# Patient Record
Sex: Male | Born: 1935
Health system: Southern US, Community
[De-identification: ages and names within clinical notes are randomized; demographics above are authoritative.]

## PROBLEM LIST (undated history)

## (undated) DIAGNOSIS — E785 Hyperlipidemia, unspecified: Secondary | ICD-10-CM

## (undated) DIAGNOSIS — E119 Type 2 diabetes mellitus without complications: Secondary | ICD-10-CM

## (undated) DIAGNOSIS — I10 Essential (primary) hypertension: Secondary | ICD-10-CM

## (undated) DIAGNOSIS — I4891 Unspecified atrial fibrillation: Secondary | ICD-10-CM

## (undated) DIAGNOSIS — E1149 Type 2 diabetes mellitus with other diabetic neurological complication: Secondary | ICD-10-CM

## (undated) NOTE — *Deleted (*Deleted)
  Subjective: Pain controlled. Tolerating foley. Foley clear yellow.  Objective: Vital signs in last 24 hours: Temp:  [97.7 F (36.5 C)-99.3 F (37.4 C)] 97.7 F (36.5 C) (11/14 1253) Pulse Rate:  [73-96] 76 (11/14 1253) Resp:  [16-18] 18 (11/14 1253) BP: (102-127)/(62-90) 109/90 (11/14 1253) SpO2:  [96 %-100 %] 96 % (11/14 1253)  Intake/Output from previous day: 11/13 0701 - 11/14 0700 In: 1015.7 [I.V.:905.7] Out: 2700 [Urine:2700] Intake/Output this shift: Total I/O In: 528.3 [I.V.:528.3] Out: 1000 [Urine:1000]  Physical Exam:  General: Alert and oriented CV: RRR Lungs: Clear Abdomen: Soft, ND, NT Ext: NT, No erythema  Lab Results: Recent Labs    09/12/20 0320 09/13/20 0104  HGB 11.4* 10.7*  HCT 35.0* 32.6*   BMET Recent Labs    09/12/20 1202 09/13/20 0104  NA 139 139  K 3.4* 3.3*  CL 107 107  CO2 24 23  GLUCOSE 186* 98  BUN 24* 17  CREATININE 2.06* 1.36*  CALCIUM 7.8* 7.5*     Studies/Results: No results found.  Assessment/Plan: 1. Traumatic Foley catheter of time causing gross hematuria. S/p cysto/foley over wire on 11/13.  2. CVA: MRI 09/10/2020 with right MCA infarct.On Eliquis. 3. Atrial fibrillation: On Eliquis 4. History of hypertension, dyslipidemia, diabetes  -Urine clear with slight pink tinge -Leave catheter in place for at least 7 days. Plan for VT on 11/22. Messaged schedulers to arrange f/u in office. -Ok for [facility] from GU standpoint -Following peripherally***   LOS: 2 days   Jannifer Hick 09/13/2020, 4:02 PM Matt R. Ranard Harte MD [facility]  Pager: [phone]

---

## 1998-04-28 ENCOUNTER — Emergency Department (HOSPITAL_COMMUNITY): Admission: EM | Admit: 1998-04-28 | Discharge: 1998-04-28 | Payer: Self-pay | Admitting: Emergency Medicine

## 2002-12-06 ENCOUNTER — Ambulatory Visit (HOSPITAL_COMMUNITY): Admission: RE | Admit: 2002-12-06 | Discharge: 2002-12-06 | Payer: Self-pay | Admitting: Gastroenterology

## 2002-12-06 ENCOUNTER — Encounter (INDEPENDENT_AMBULATORY_CARE_PROVIDER_SITE_OTHER): Payer: Self-pay | Admitting: Specialist

## 2007-07-19 ENCOUNTER — Emergency Department (HOSPITAL_COMMUNITY): Admission: EM | Admit: 2007-07-19 | Discharge: 2007-07-19 | Payer: Self-pay | Admitting: Emergency Medicine

## 2007-07-28 ENCOUNTER — Ambulatory Visit (HOSPITAL_COMMUNITY): Admission: RE | Admit: 2007-07-28 | Discharge: 2007-07-29 | Payer: Self-pay | Admitting: Orthopaedic Surgery

## 2010-04-22 ENCOUNTER — Emergency Department (HOSPITAL_COMMUNITY): Admission: EM | Admit: 2010-04-22 | Discharge: 2010-04-22 | Payer: Self-pay | Admitting: Emergency Medicine

## 2011-03-15 NOTE — Op Note (Signed)
NAME:  Jason Hunter, Jason Hunter NO.:  [id number]   MEDICAL RECORD NO.:  [id number]          PATIENT TYPE:  OIB   LOCATION:  5035                         FACILITY:  [facility]   PHYSICIAN:  Dionne Ano. Gramig III, M.D.DATE OF BIRTH:  28-May-1928   DATE OF PROCEDURE:  07/28/2007  DATE OF DISCHARGE:                               OPERATIVE REPORT   PREOPERATIVE DIAGNOSIS:  Right elbow/radial head fracture with  displacement and ligamentous insufficiency, rule out additional internal  derangement.   POSTOPERATIVE DIAGNOSIS:  Right elbow/radial head fracture with  displacement and ligamentous insufficiency, rule out additional internal  derangement with noted large triceps tendon tear complete in nature off  of the olecranon.   SURGICAL PROCEDURE PERFORMED:  1. Open reduction and internal fixation radial head fracture with      multiple mini-AccuTrak screws.  2. Right lateral ulnar collateral ligament repair with suture anchor      and FiberWire fixation.  3. Stress radiography.  4. Cubital tunnel release (ulnar nerve release) in situ, right elbow.  5. Triceps reconstruction/direct repair, right elbow, with FiberWire      suture and bony tunnel technique.   SURGEON:  Dionne Ano. Amanda Pea, M.D.   ASSISTANT:  Karie Chimera, P.A.-C.   COMPLICATIONS:  None.   ANESTHESIA:  General.   ESTIMATED BLOOD LOSS:  Less than 50 mL.   DRAINS:  One.   INDICATIONS FOR PROCEDURE:  This patient is a very pleasant male who  presents with the above mentioned diagnosis.  I have counseled him with  regards to the risks and benefits of surgery including the risks of  infection, bleeding, anesthesia, damage to normal structures, and  failure of surgery to accomplish its intended goals of relieving  symptoms and restoring function. With this in mind, he desires to  proceed.  All questions were encouraged and answered preoperatively.   OPERATIVE PROCEDURE:  The patient was seen by myself and  anesthesia,  taken to the operating suite, and underwent the smooth induction of  general anesthesia.  Once under general anesthesia, the patient was  examined. We were able to get a nice exam revealing an audible clunk,  crepitance, and subluxation secondary to the displaced fracture about  the radial head. In addition to this, the patient had a very large  sulcus type sign which was evident on palpation of his triceps  suggesting a triceps tear.  The patient was examined at length and  following this, was prepped and draped in the usual sterile fashion.  Once this was done, a curvilinear posterolateral incision was made under  250 mmHg tourniquet control.  I should note that the arm was marked,  permit was signed, and Ancef was given prior to incision and general  anesthetic.   The incision was made.  Dissection was carried down. Skin flaps were  elevated nicely. At this point in time, I identified the crepitus about  the fracture site and, of course, identified the triceps tendon.  I was  anxious to see if the triceps tendon was completely avulsed and it, in  fact, was.  I suctioned  out the bloody debris and cleaned out the bursa.  A bursectomy was performed about this area.   Following this, it was very apparent that the patient would require  triceps, lateral ulnar, collateral ligament, and radial head  reconstruction.  I, at this time, placed retractors well medially and  elevated a posterior flap of skin.  The ulnar nerve was then identified  and released about the arcade of Struthers, medial intermuscular septum,  two heads of the FCU, cubital tunnel, and Osborne's ligament.  It sat  tension free in its groove and did not sublux.  This completed the in  situ ulnar nerve release which was absolutely necessary given the bloody  hematoma in the vicinity and the triceps reconstruction that would be  necessary.  After the ulnar nerve was released, I then turned attention  towards  the triceps tendon.   The triceps tendon was identified.  It was mobilized appropriately and a  [place] stitch was placed with suture strands exiting distally. The bony  area about the olecranon was then prepared, curet, rongeur, and removal  of the bony spur was accomplished followed by placement of drill holes,  crisscrossed in nature, which exited distally in the shaft region.  Mellody Dance needles were placed crisscrossed through this area and the  FiberWire suture were threaded through the [place] needles and then  advanced.  This allowed for reinsertion of the triceps to its anatomic  point of insertion.  At this point in time, I did not want to completely  repair the triceps but tagged the sutures to be repaired as the final  step later in the case.   At this juncture, I made an interval between the ECU and anconeus,  dissected down, identified the lateral ulnar collateral ligament which  was avulsed as well as the radial head fracture.  I should note the  patient had the joint open and a large amount of scuffing about the  capitellum was noted.  Small bits of cartilage were removed and any  nonviable fragments were completely swept from the field with  irrigation, suction and pick up.  Following cleaning the joint out, I  then reassembled the radial head fracture.  There was a very large  radial head piece which was reassembled and four mini-AccuTrak screws  were placed to transfix this in an anatomic reduction.  The patient had  excellent range of motion, recreation of the joint anatomy, and no  complicating features.  I felt excellent about the fixation and felt  that it was firm and stable.  The patient did have a slight defect which  produced a very small amount of crepitance when the patient was  hyperpronated, however, it was my feeling that this would be certainly a  better construct (ORIF than a prosthesis) as we maintained his regular  anatomy.  Overall, the very large piece of  bone sat nicely and the  screws transfixed this area excellently.  With this noted, I then  checked the patient under AP, lateral, and oblique x-rays and noted they  all looked quite well.  I was pleased with the findings.  Once stress  radiography and ORIF of the radial head was performed, I turned  attention towards the ligamentous architecture.   The lateral ulnar collateral ligament was repaired with a 5.5 Arthrex  suture anchor.  The anchor was placed without difficulty at the  epicenter of the lateral condyle region and was then sutured to the  lateral ulnar collateral  ligament.  This was then tied down to repair  the lateral ulnar collateral ligament and additional imbricating sutures  were then placed.  Following this, I closed the interval about the  lateral aspect of the ECU anconeus region deeply.  The patient did not  have destabilization of the annular ligament I should note.   Once this was done, attention was then turned back towards the triceps.  The arm was placed in extension.  The sutures were tied down and then  additional throws were placed through the triceps tendon edges to make a  fine smooth fit with the repair.  This was a bony tunnel repair, of  course.  Following this, the patient was stable at 90 degrees.  He had  no instability. The ulnar nerve looked excellent and I was pleased with  this. I irrigated very copiously and then closed the wound over a medium  Hemovac drain with 2-0 Vicryl followed by the staple gun.  Once this was  done, the patient was placed in a long arm splint with the arm at 30  degrees of flexion.  He tolerated this well and was taken to recovery  room after extubation.  He will be monitored closely.   We will plan for IV antibiotics, general postop management including IV  fluids, pain medicine, muscle relaxers, and will plan to see him back in  the office in 10-14 days for suture removal.  At the time of the first  postop visit, the  skin clips will be removed, x-rays will be taken, AP,  lateral and coyle views.  I will have therapy place him in a long arm  splint at 30 degrees and we will allow him to begin some very gentle  motion at the three week postop mark.  At three weeks postop, we will  begin active biceps gravity extension, triceps, and will work him up 10-  15 degrees each week.  I am going to allow him some very gentle  pronation and supination within a 50/50 arc of motion and simply watch  him radiographically. Absolutely no strengthening until 10-12 weeks and,  for the most part, we are simply going to work on his motion.  The  triceps tendon will need least 8-10 weeks to heal given its significant  fraying and, thus, for the first 6-8 weeks, I do not want him to go past  90 degrees as he is increasing 10 to 15 degrees each week.  We will need  to monitor his care very closely, of course, and assess his fracture for  complete consolidation and healing.  I have discussed with the patient  and his family all issues and all questions have been encouraged and  answered.           ______________________________  Dionne Ano. Everlene Other, M.D.     Nash Mantis  D:  07/28/2007  T:  07/28/2007  Job:  [id number]

## 2011-03-18 NOTE — Op Note (Signed)
   NAME:  Jason Hunter, Jason Hunter                         ACCOUNT NO.:  [id number]   MEDICAL RECORD NO.:  [id number]                   PATIENT TYPE:  AMB   LOCATION:  ENDO                                 FACILITY:  [facility]   PHYSICIAN:  Danise Edge, M.D.                DATE OF BIRTH:  1928-10-31   DATE OF PROCEDURE:  12/06/2002  DATE OF DISCHARGE:                                 OPERATIVE REPORT   PROCEDURE:  Colonoscopy and sigmoid polypectomy.   INDICATIONS:  The patient is a 77 year old male born [birth year].  The patient  underwent a colonoscopy with polypectomy five years ago.  He is due for  screening colonoscopy with polypectomy to prevent colon cancer.   ENDOSCOPIST:  Danise Edge, M.D.   PREMEDICATION:  Versed 7.5 mg, Demerol 75 mg.   ENDOSCOPIST:  Olympus pediatric colonoscope.   DESCRIPTION OF PROCEDURE:  After obtaining informed consent, the patient was  placed in the left lateral decubitus position.  I administered intravenous  Demerol and intravenous Versed to achieve conscious sedation for the  procedure.  The patient's blood pressure, oxygen saturation, and cardiac  rhythm were monitored throughout the procedure and documented in the medical  record.   Anal inspection was normal.  Digital rectal revealed an enlarged but non-  nodular prostate.  The Olympus pediatric video colonoscope was introduced  into the rectum and advanced to the cecum.  Colonic preparation for the exam  today was excellent.   Rectum normal.  Moderate-sized internal hemorrhoids.   Sigmoid colon and descending colon:  Left colonic diverticulosis.  At  approximately 35 cm from the anal verge, a 1 cm pedunculated polyp was  removed with the electrocautery snare.   Splenic flexure normal.   Transverse colon normal.   Hepatic flexure normal.   Ascending colon normal.   Cecum and ileocecal valve normal.   ASSESSMENT:  1. A 1 cm pedunculated polyp was removed from the sigmoid colon.  2. Left  colonic diverticulosis.  3.     Moderate-sized internal hemorrhoids.  4. Enlarged, non-nodular prostate.   RECOMMENDATIONS:  Repeat colonoscopy in five years.                                               Danise Edge, M.D.    MJ/MEDQ  D:  12/06/2002  T:  12/06/2002  Job:  [id number]   cc:   Thora Lance, M.D.  [address]  [place]  [place] [address]  Fax: [phone]

## 2011-08-11 LAB — BASIC METABOLIC PANEL
BUN: 18
CO2: 27
Chloride: 100
Chloride: 100
Creatinine, Ser: 1.2
GFR calc Af Amer: >60
GFR calc non Af Amer: 58 — ABNORMAL LOW
Potassium: 3.8
Sodium: 133 — ABNORMAL LOW
Sodium: 135

## 2011-08-11 LAB — URINE MICROSCOPIC-ADD ON

## 2011-08-11 LAB — CBC
HCT: 36.1 — ABNORMAL LOW
Hemoglobin: 11.3 — ABNORMAL LOW
Hemoglobin: 12 — ABNORMAL LOW
MCHC: 34
MCV: 94.2
MCV: 96.1
RBC: 3.54 — ABNORMAL LOW
RBC: 3.75 — ABNORMAL LOW
WBC: 12.4 — ABNORMAL HIGH
WBC: 6.2

## 2011-08-11 LAB — URINALYSIS, ROUTINE W REFLEX MICROSCOPIC
Bilirubin Urine: NEGATIVE
Glucose, UA: >1000 — AB
Hgb urine dipstick: NEGATIVE
Ketones, ur: NEGATIVE
Nitrite: NEGATIVE
Specific Gravity, Urine: 1.026
pH: 5

## 2011-08-11 LAB — HEMOGLOBIN A1C
Hgb A1c MFr Bld: 7.1 — ABNORMAL HIGH
Mean Plasma Glucose: 175

## 2013-04-30 DEATH — deceased

## 2013-10-15 ENCOUNTER — Encounter (INDEPENDENT_AMBULATORY_CARE_PROVIDER_SITE_OTHER): Payer: Medicare Other | Admitting: Ophthalmology

## 2013-10-15 DIAGNOSIS — E1139 Type 2 diabetes mellitus with other diabetic ophthalmic complication: Secondary | ICD-10-CM

## 2013-10-15 DIAGNOSIS — E11319 Type 2 diabetes mellitus with unspecified diabetic retinopathy without macular edema: Secondary | ICD-10-CM

## 2013-10-15 DIAGNOSIS — H43819 Vitreous degeneration, unspecified eye: Secondary | ICD-10-CM

## 2013-10-15 DIAGNOSIS — H35039 Hypertensive retinopathy, unspecified eye: Secondary | ICD-10-CM

## 2013-10-15 DIAGNOSIS — H35379 Puckering of macula, unspecified eye: Secondary | ICD-10-CM

## 2013-10-15 DIAGNOSIS — I10 Essential (primary) hypertension: Secondary | ICD-10-CM

## 2016-01-30 DEATH — deceased

## 2016-07-05 ENCOUNTER — Other Ambulatory Visit: Payer: Self-pay | Admitting: Internal Medicine

## 2016-07-05 DIAGNOSIS — I4891 Unspecified atrial fibrillation: Secondary | ICD-10-CM

## 2016-07-07 ENCOUNTER — Encounter (HOSPITAL_COMMUNITY): Payer: Self-pay

## 2016-07-07 ENCOUNTER — Emergency Department (HOSPITAL_COMMUNITY)
Admission: EM | Admit: 2016-07-07 | Discharge: 2016-07-07 | Disposition: A | Discharge status: Home / Self Care | Payer: Medicare Other | Attending: Emergency Medicine | Admitting: Emergency Medicine

## 2016-07-07 DIAGNOSIS — I10 Essential (primary) hypertension: Secondary | ICD-10-CM | POA: Diagnosis not present

## 2016-07-07 DIAGNOSIS — K625 Hemorrhage of anus and rectum: Secondary | ICD-10-CM | POA: Diagnosis present

## 2016-07-07 DIAGNOSIS — Z79899 Other long term (current) drug therapy: Secondary | ICD-10-CM | POA: Insufficient documentation

## 2016-07-07 DIAGNOSIS — E119 Type 2 diabetes mellitus without complications: Secondary | ICD-10-CM | POA: Insufficient documentation

## 2016-07-07 DIAGNOSIS — Z7984 Long term (current) use of oral hypoglycemic drugs: Secondary | ICD-10-CM | POA: Diagnosis not present

## 2016-07-07 DIAGNOSIS — Z7901 Long term (current) use of anticoagulants: Secondary | ICD-10-CM | POA: Insufficient documentation

## 2016-07-07 HISTORY — DX: Type 2 diabetes mellitus without complications: E11.9

## 2016-07-07 HISTORY — DX: Essential (primary) hypertension: I10

## 2016-07-07 LAB — CBC
HEMATOCRIT: 38.1 % — AB (ref 39.0–52.0)
HEMOGLOBIN: 12.1 g/dL — AB (ref 13.0–17.0)
MCH: 30.9 pg (ref 26.0–34.0)
MCHC: 31.8 g/dL (ref 30.0–36.0)
MCV: 97.4 fL (ref 78.0–100.0)
Platelets: 314 10*3/uL (ref 150–400)
RBC: 3.91 MIL/uL — ABNORMAL LOW (ref 4.22–5.81)
RDW: 13.7 % (ref 11.5–15.5)
WBC: 5.6 10*3/uL (ref 4.0–10.5)

## 2016-07-07 LAB — COMPREHENSIVE METABOLIC PANEL
ALBUMIN: 3.7 g/dL (ref 3.5–5.0)
ALK PHOS: 134 U/L — AB (ref 38–126)
ALT: 8 U/L — ABNORMAL LOW (ref 17–63)
ANION GAP: 9 (ref 5–15)
AST: 16 U/L (ref 15–41)
BILIRUBIN TOTAL: 1 mg/dL (ref 0.3–1.2)
BUN: 14 mg/dL (ref 6–20)
CALCIUM: 9 mg/dL (ref 8.9–10.3)
CO2: 25 mmol/L (ref 22–32)
Chloride: 105 mmol/L (ref 101–111)
Creatinine, Ser: 1.04 mg/dL (ref 0.61–1.24)
GLUCOSE: 174 mg/dL — AB (ref 65–99)
POTASSIUM: 4.7 mmol/L (ref 3.5–5.1)
Sodium: 139 mmol/L (ref 135–145)
TOTAL PROTEIN: 7 g/dL (ref 6.5–8.1)

## 2016-07-07 LAB — POC OCCULT BLOOD, ED: FECAL OCCULT BLD: POSITIVE — AB

## 2016-07-07 LAB — PROTIME-INR
INR: 1.8
PROTHROMBIN TIME: 21.1 s — AB (ref 11.4–15.2)

## 2016-07-07 NOTE — ED Provider Notes (Signed)
[facility]-EMERGENCY DEPT Provider Note   CSN: [id number] Arrival date & time: 07/07/16  1000     History   Chief Complaint Chief Complaint  Patient presents with  . Nausea  . Blood In Stools    HPI Jason Hunter is a 82 y.o. male.  Patient has recently been switched over to low wall toe from aspirin. He started having some dark red blood per rectum. Patient not having any weakness dizziness no pain   The history is provided by the patient. No language interpreter was used.  Rectal Bleeding  Quality:  Maroon Amount:  Moderate Timing:  Intermittent Chronicity:  New Context: not anal fissures   Similar prior episodes: no   Relieved by:  Nothing Worsened by:  Nothing Associated symptoms: no abdominal pain   Risk factors: anticoagulant use     Past Medical History:  Diagnosis Date  . Diabetes mellitus without complication (HCC)   . Hypertension     There are no active problems to display for this patient.   History reviewed. No pertinent surgical history.     Home Medications    Prior to Admission medications   Medication Sig Start Date End Date Taking? Authorizing Provider  carvedilol (COREG) 25 MG tablet Take 25 mg by mouth 2 (two) times daily. 06/19/16  Yes Historical Provider, MD  chlorthalidone (HYGROTON) 25 MG tablet Take 25 mg by mouth every morning. 06/06/16  Yes Historical Provider, MD  felodipine (PLENDIL) 10 MG 24 hr tablet Take 10 mg by mouth daily. 06/19/16  Yes Historical Provider, MD  glipiZIDE (GLUCOTROL XL) 5 MG 24 hr tablet Take 5 mg by mouth daily. 06/14/16  Yes Historical Provider, MD  potassium chloride SA (K-DUR,KLOR-CON) 20 MEQ tablet Take 20 mEq by mouth daily. Take 1 tablet twice daily for 3 days, then continue 1 tablet daily, thereafter 07/05/16  Yes Historical Provider, MD  pravastatin (PRAVACHOL) 20 MG tablet Take 20 mg by mouth every morning. 06/25/16  Yes Historical Provider, MD  rivaroxaban (XARELTO) 20 MG TABS tablet Take 20 mg by mouth  daily.   Yes Historical Provider, MD  telmisartan (MICARDIS) 80 MG tablet Take 80 mg by mouth daily. 06/07/16  Yes Historical Provider, MD    Family History No family history on file.  Social History Social History  Substance Use Topics  . Smoking status: Never Smoker  . Smokeless tobacco: Never Used  . Alcohol use No     Allergies   Review of patient's allergies indicates no known allergies.   Review of Systems Review of Systems  Constitutional: Negative for appetite change and fatigue.  HENT: Negative for congestion, ear discharge and sinus pressure.   Eyes: Negative for discharge.  Respiratory: Negative for cough.   Cardiovascular: Negative for chest pain.  Gastrointestinal: Positive for hematochezia. Negative for abdominal pain and diarrhea.       Rectal bleeding  Genitourinary: Negative for frequency and hematuria.  Musculoskeletal: Negative for back pain.  Skin: Negative for rash.  Neurological: Negative for seizures and headaches.  Psychiatric/Behavioral: Negative for hallucinations.     Physical Exam Updated Vital Signs BP 130/84   Pulse (!) 57   Temp 98.1 F (36.7 C) (Oral)   Resp 15   SpO2 100%   Physical Exam  Constitutional: He is oriented to person, place, and time. He appears well-developed.  HENT:  Head: Normocephalic.  Eyes: Conjunctivae and EOM are normal. No scleral icterus.  Neck: Neck supple. No thyromegaly present.  Cardiovascular: Normal rate and  regular rhythm.  Exam reveals no gallop and no friction rub.   No murmur heard. Pulmonary/Chest: No stridor. He has no wheezes. He has no rales. He exhibits no tenderness.  Abdominal: He exhibits no distension. There is no tenderness. There is no rebound.  Genitourinary: Rectal exam shows guaiac positive stool.  Genitourinary Comments: Maroon heme positive stool  Musculoskeletal: Normal range of motion. He exhibits no edema.  Lymphadenopathy:    He has no cervical adenopathy.  Neurological: He  is oriented to person, place, and time. He exhibits normal muscle tone. Coordination normal.  Skin: No rash noted. No erythema.  Psychiatric: He has a normal mood and affect. His behavior is normal.     ED Treatments / Results  Labs (all labs ordered are listed, but only abnormal results are displayed) Labs Reviewed  COMPREHENSIVE METABOLIC PANEL - Abnormal; Notable for the following:       Result Value   Glucose, Bld 174 (*)    ALT 8 (*)    Alkaline Phosphatase 134 (*)    All other components within normal limits  CBC - Abnormal; Notable for the following:    RBC 3.91 (*)    Hemoglobin 12.1 (*)    HCT 38.1 (*)    All other components within normal limits  PROTIME-INR - Abnormal; Notable for the following:    Prothrombin Time 21.1 (*)    All other components within normal limits  POC OCCULT BLOOD, ED - Abnormal; Notable for the following:    Fecal Occult Bld POSITIVE (*)    All other components within normal limits    EKG  EKG Interpretation None       Radiology No results found.  Procedures Procedures (including critical care time)  Medications Ordered in ED Medications - No data to display   Initial Impression / Assessment and Plan / ED Course  I have reviewed the triage vital signs and the nursing notes.  Pertinent labs & imaging results that were available during my care of the patient were reviewed by me and considered in my medical decision making (see chart for details).  Clinical Course    Patient with rectal bleeding. Patient is not tachycardic or hypotensive. Patient complaining dizziness no pain. Labs unremarkable. Patient prefers to be discharged home follow-up with his doctor. He was instructed to stop taking those were also call his doctor tomorrow for follow-up.   Patient did not want to be admitted and preferred outpatient treatment. He was told to return for weakness dizziness abdominal pain or moderate bleeding  Final Clinical Impressions(s) /  ED Diagnoses   Final diagnoses:  Rectal bleeding    New Prescriptions New Prescriptions   No medications on file     Bethann Berkshire, MD 07/07/16 1523

## 2016-07-07 NOTE — Discharge Instructions (Signed)
Stop taking your xarelto.   Follow up with your md next week.  Return if feeling weak or dizzy or you have significant bleeding

## 2016-07-07 NOTE — ED Triage Notes (Signed)
Pt. Reports starting a new medication this week and yesterday he strted having loose stoole with dark red blood in it.  Dr. Valentina LucksGriffin sent pt. To us for a further workup..  Pt. Denies any n/v.  No distress noted.  Pt. Denies any abdominal pain .

## 2016-07-07 NOTE — ED Notes (Signed)
Patient denies pain and is resting comfortably.  

## 2016-07-12 ENCOUNTER — Other Ambulatory Visit (HOSPITAL_COMMUNITY): Payer: Self-pay

## 2016-08-30 ENCOUNTER — Ambulatory Visit (HOSPITAL_COMMUNITY): Payer: Medicare Other | Attending: Cardiology

## 2016-08-30 ENCOUNTER — Other Ambulatory Visit: Payer: Self-pay

## 2016-08-30 DIAGNOSIS — I4891 Unspecified atrial fibrillation: Secondary | ICD-10-CM | POA: Diagnosis not present

## 2016-11-28 DIAGNOSIS — I10 Essential (primary) hypertension: Secondary | ICD-10-CM | POA: Diagnosis not present

## 2016-11-28 DIAGNOSIS — E119 Type 2 diabetes mellitus without complications: Secondary | ICD-10-CM | POA: Diagnosis not present

## 2016-11-28 DIAGNOSIS — I481 Persistent atrial fibrillation: Secondary | ICD-10-CM | POA: Diagnosis not present

## 2016-11-28 DIAGNOSIS — Z7984 Long term (current) use of oral hypoglycemic drugs: Secondary | ICD-10-CM | POA: Diagnosis not present

## 2017-05-24 DIAGNOSIS — E7800 Pure hypercholesterolemia, unspecified: Secondary | ICD-10-CM | POA: Diagnosis not present

## 2017-05-24 DIAGNOSIS — N182 Chronic kidney disease, stage 2 (mild): Secondary | ICD-10-CM | POA: Diagnosis not present

## 2017-05-24 DIAGNOSIS — I10 Essential (primary) hypertension: Secondary | ICD-10-CM | POA: Diagnosis not present

## 2017-05-24 DIAGNOSIS — E1165 Type 2 diabetes mellitus with hyperglycemia: Secondary | ICD-10-CM | POA: Diagnosis not present

## 2017-05-24 DIAGNOSIS — I481 Persistent atrial fibrillation: Secondary | ICD-10-CM | POA: Diagnosis not present

## 2017-05-24 DIAGNOSIS — Z1389 Encounter for screening for other disorder: Secondary | ICD-10-CM | POA: Diagnosis not present

## 2017-05-24 DIAGNOSIS — Z0000 Encounter for general adult medical examination without abnormal findings: Secondary | ICD-10-CM | POA: Diagnosis not present

## 2017-05-24 DIAGNOSIS — Z7984 Long term (current) use of oral hypoglycemic drugs: Secondary | ICD-10-CM | POA: Diagnosis not present

## 2017-05-24 DIAGNOSIS — E1122 Type 2 diabetes mellitus with diabetic chronic kidney disease: Secondary | ICD-10-CM | POA: Diagnosis not present

## 2018-05-29 DIAGNOSIS — E1122 Type 2 diabetes mellitus with diabetic chronic kidney disease: Secondary | ICD-10-CM | POA: Diagnosis not present

## 2018-05-29 DIAGNOSIS — I481 Persistent atrial fibrillation: Secondary | ICD-10-CM | POA: Diagnosis not present

## 2018-05-29 DIAGNOSIS — E7800 Pure hypercholesterolemia, unspecified: Secondary | ICD-10-CM | POA: Diagnosis not present

## 2018-05-29 DIAGNOSIS — I10 Essential (primary) hypertension: Secondary | ICD-10-CM | POA: Diagnosis not present

## 2018-05-29 DIAGNOSIS — Z0000 Encounter for general adult medical examination without abnormal findings: Secondary | ICD-10-CM | POA: Diagnosis not present

## 2018-05-29 DIAGNOSIS — Z1389 Encounter for screening for other disorder: Secondary | ICD-10-CM | POA: Diagnosis not present

## 2018-05-29 DIAGNOSIS — Z7189 Other specified counseling: Secondary | ICD-10-CM | POA: Diagnosis not present

## 2018-05-29 DIAGNOSIS — Z7984 Long term (current) use of oral hypoglycemic drugs: Secondary | ICD-10-CM | POA: Diagnosis not present

## 2018-05-29 DIAGNOSIS — E1165 Type 2 diabetes mellitus with hyperglycemia: Secondary | ICD-10-CM | POA: Diagnosis not present

## 2018-05-29 DIAGNOSIS — N400 Enlarged prostate without lower urinary tract symptoms: Secondary | ICD-10-CM | POA: Diagnosis not present

## 2018-12-03 DIAGNOSIS — N182 Chronic kidney disease, stage 2 (mild): Secondary | ICD-10-CM | POA: Diagnosis not present

## 2018-12-03 DIAGNOSIS — E1122 Type 2 diabetes mellitus with diabetic chronic kidney disease: Secondary | ICD-10-CM | POA: Diagnosis not present

## 2018-12-03 DIAGNOSIS — I4819 Other persistent atrial fibrillation: Secondary | ICD-10-CM | POA: Diagnosis not present

## 2018-12-03 DIAGNOSIS — I10 Essential (primary) hypertension: Secondary | ICD-10-CM | POA: Diagnosis not present

## 2018-12-03 DIAGNOSIS — Z23 Encounter for immunization: Secondary | ICD-10-CM | POA: Diagnosis not present

## 2018-12-27 DIAGNOSIS — H40013 Open angle with borderline findings, low risk, bilateral: Secondary | ICD-10-CM | POA: Diagnosis not present

## 2019-06-06 DIAGNOSIS — I10 Essential (primary) hypertension: Secondary | ICD-10-CM | POA: Diagnosis not present

## 2019-06-06 DIAGNOSIS — I4819 Other persistent atrial fibrillation: Secondary | ICD-10-CM | POA: Diagnosis not present

## 2019-06-06 DIAGNOSIS — Z7984 Long term (current) use of oral hypoglycemic drugs: Secondary | ICD-10-CM | POA: Diagnosis not present

## 2019-06-06 DIAGNOSIS — E1139 Type 2 diabetes mellitus with other diabetic ophthalmic complication: Secondary | ICD-10-CM | POA: Diagnosis not present

## 2019-06-06 DIAGNOSIS — E7800 Pure hypercholesterolemia, unspecified: Secondary | ICD-10-CM | POA: Diagnosis not present

## 2019-06-06 DIAGNOSIS — Z0000 Encounter for general adult medical examination without abnormal findings: Secondary | ICD-10-CM | POA: Diagnosis not present

## 2019-06-06 DIAGNOSIS — E1122 Type 2 diabetes mellitus with diabetic chronic kidney disease: Secondary | ICD-10-CM | POA: Diagnosis not present

## 2019-06-06 DIAGNOSIS — Z1389 Encounter for screening for other disorder: Secondary | ICD-10-CM | POA: Diagnosis not present

## 2019-06-06 DIAGNOSIS — N182 Chronic kidney disease, stage 2 (mild): Secondary | ICD-10-CM | POA: Diagnosis not present

## 2019-06-06 DIAGNOSIS — N400 Enlarged prostate without lower urinary tract symptoms: Secondary | ICD-10-CM | POA: Diagnosis not present

## 2019-12-10 DIAGNOSIS — E1122 Type 2 diabetes mellitus with diabetic chronic kidney disease: Secondary | ICD-10-CM | POA: Diagnosis not present

## 2019-12-10 DIAGNOSIS — N182 Chronic kidney disease, stage 2 (mild): Secondary | ICD-10-CM | POA: Diagnosis not present

## 2019-12-10 DIAGNOSIS — I10 Essential (primary) hypertension: Secondary | ICD-10-CM | POA: Diagnosis not present

## 2019-12-10 DIAGNOSIS — I4819 Other persistent atrial fibrillation: Secondary | ICD-10-CM | POA: Diagnosis not present

## 2019-12-29 DIAGNOSIS — Z23 Encounter for immunization: Secondary | ICD-10-CM | POA: Diagnosis not present

## 2020-01-26 DIAGNOSIS — Z23 Encounter for immunization: Secondary | ICD-10-CM | POA: Diagnosis not present

## 2020-09-10 ENCOUNTER — Emergency Department (HOSPITAL_COMMUNITY): Payer: Medicare Other

## 2020-09-10 ENCOUNTER — Encounter (HOSPITAL_COMMUNITY): Payer: Self-pay | Admitting: Internal Medicine

## 2020-09-10 ENCOUNTER — Observation Stay (HOSPITAL_COMMUNITY): Payer: Medicare Other

## 2020-09-10 ENCOUNTER — Inpatient Hospital Stay (HOSPITAL_COMMUNITY)
Admission: EM | Admit: 2020-09-10 | Discharge: 2020-09-13 | DRG: 982 | Disposition: A | Discharge status: Home Health | Payer: Medicare Other | Attending: Internal Medicine | Admitting: Internal Medicine

## 2020-09-10 DIAGNOSIS — I10 Essential (primary) hypertension: Secondary | ICD-10-CM | POA: Diagnosis present

## 2020-09-10 DIAGNOSIS — R339 Retention of urine, unspecified: Secondary | ICD-10-CM | POA: Diagnosis not present

## 2020-09-10 DIAGNOSIS — Y92239 Unspecified place in hospital as the place of occurrence of the external cause: Secondary | ICD-10-CM | POA: Diagnosis not present

## 2020-09-10 DIAGNOSIS — H539 Unspecified visual disturbance: Secondary | ICD-10-CM | POA: Diagnosis present

## 2020-09-10 DIAGNOSIS — G459 Transient cerebral ischemic attack, unspecified: Secondary | ICD-10-CM | POA: Diagnosis not present

## 2020-09-10 DIAGNOSIS — Z7901 Long term (current) use of anticoagulants: Secondary | ICD-10-CM

## 2020-09-10 DIAGNOSIS — I480 Paroxysmal atrial fibrillation: Secondary | ICD-10-CM | POA: Diagnosis not present

## 2020-09-10 DIAGNOSIS — E7800 Pure hypercholesterolemia, unspecified: Secondary | ICD-10-CM | POA: Diagnosis present

## 2020-09-10 DIAGNOSIS — R471 Dysarthria and anarthria: Secondary | ICD-10-CM | POA: Diagnosis present

## 2020-09-10 DIAGNOSIS — R310 Gross hematuria: Secondary | ICD-10-CM | POA: Diagnosis present

## 2020-09-10 DIAGNOSIS — Z20822 Contact with and (suspected) exposure to covid-19: Secondary | ICD-10-CM | POA: Diagnosis present

## 2020-09-10 DIAGNOSIS — E049 Nontoxic goiter, unspecified: Secondary | ICD-10-CM | POA: Diagnosis present

## 2020-09-10 DIAGNOSIS — R338 Other retention of urine: Secondary | ICD-10-CM

## 2020-09-10 DIAGNOSIS — Y848 Other medical procedures as the cause of abnormal reaction of the patient, or of later complication, without mention of misadventure at the time of the procedure: Secondary | ICD-10-CM | POA: Diagnosis not present

## 2020-09-10 DIAGNOSIS — I63411 Cerebral infarction due to embolism of right middle cerebral artery: Principal | ICD-10-CM | POA: Diagnosis present

## 2020-09-10 DIAGNOSIS — Z7984 Long term (current) use of oral hypoglycemic drugs: Secondary | ICD-10-CM

## 2020-09-10 DIAGNOSIS — E1349 Other specified diabetes mellitus with other diabetic neurological complication: Secondary | ICD-10-CM | POA: Diagnosis not present

## 2020-09-10 DIAGNOSIS — I6389 Other cerebral infarction: Secondary | ICD-10-CM

## 2020-09-10 DIAGNOSIS — E119 Type 2 diabetes mellitus without complications: Secondary | ICD-10-CM | POA: Diagnosis present

## 2020-09-10 DIAGNOSIS — E1149 Type 2 diabetes mellitus with other diabetic neurological complication: Secondary | ICD-10-CM | POA: Diagnosis present

## 2020-09-10 DIAGNOSIS — Z9114 Patient's other noncompliance with medication regimen: Secondary | ICD-10-CM

## 2020-09-10 DIAGNOSIS — I69354 Hemiplegia and hemiparesis following cerebral infarction affecting left non-dominant side: Secondary | ICD-10-CM

## 2020-09-10 DIAGNOSIS — T83098A Other mechanical complication of other indwelling urethral catheter, initial encounter: Secondary | ICD-10-CM | POA: Diagnosis not present

## 2020-09-10 DIAGNOSIS — Z79899 Other long term (current) drug therapy: Secondary | ICD-10-CM

## 2020-09-10 DIAGNOSIS — R29704 NIHSS score 4: Secondary | ICD-10-CM | POA: Diagnosis present

## 2020-09-10 DIAGNOSIS — I4891 Unspecified atrial fibrillation: Secondary | ICD-10-CM | POA: Diagnosis present

## 2020-09-10 DIAGNOSIS — E785 Hyperlipidemia, unspecified: Secondary | ICD-10-CM | POA: Diagnosis present

## 2020-09-10 DIAGNOSIS — R29810 Facial weakness: Secondary | ICD-10-CM | POA: Diagnosis present

## 2020-09-10 DIAGNOSIS — I639 Cerebral infarction, unspecified: Secondary | ICD-10-CM

## 2020-09-10 HISTORY — DX: Unspecified atrial fibrillation: I48.91

## 2020-09-10 HISTORY — DX: Hyperlipidemia, unspecified: E78.5

## 2020-09-10 HISTORY — DX: Essential (primary) hypertension: I10

## 2020-09-10 HISTORY — DX: Type 2 diabetes mellitus with other diabetic neurological complication: E11.49

## 2020-09-10 LAB — URINALYSIS, ROUTINE W REFLEX MICROSCOPIC
Bilirubin Urine: NEGATIVE
Glucose, UA: NEGATIVE mg/dL
Ketones, ur: NEGATIVE mg/dL
Leukocytes,Ua: NEGATIVE
Nitrite: NEGATIVE
Protein, ur: NEGATIVE mg/dL
Specific Gravity, Urine: 1.004 — ABNORMAL LOW (ref 1.005–1.030)
pH: 7 (ref 5.0–8.0)

## 2020-09-10 LAB — RAPID URINE DRUG SCREEN, HOSP PERFORMED
Amphetamines: NOT DETECTED
Barbiturates: NOT DETECTED
Benzodiazepines: NOT DETECTED
Cocaine: NOT DETECTED
Opiates: NOT DETECTED
Tetrahydrocannabinol: NOT DETECTED

## 2020-09-10 LAB — GLUCOSE, CAPILLARY: Glucose-Capillary: 168 mg/dL — ABNORMAL HIGH (ref 70–99)

## 2020-09-10 LAB — ETHANOL: Alcohol, Ethyl (B): <10 mg/dL (ref <10)

## 2020-09-10 LAB — DIFFERENTIAL
Abs Immature Granulocytes: 0.05 10*3/uL (ref 0.00–0.07)
Basophils Absolute: 0 10*3/uL (ref 0.0–0.1)
Basophils Relative: 0 %
Eosinophils Absolute: 0 10*3/uL (ref 0.0–0.5)
Eosinophils Relative: 1 %
Immature Granulocytes: 1 %
Lymphocytes Relative: 27 %
Lymphs Abs: 1.7 10*3/uL (ref 0.7–4.0)
Monocytes Absolute: 0.8 10*3/uL (ref 0.1–1.0)
Monocytes Relative: 13 %
Neutro Abs: 3.7 10*3/uL (ref 1.7–7.7)
Neutrophils Relative %: 58 %

## 2020-09-10 LAB — CBC
HCT: 38.7 % — ABNORMAL LOW (ref 39.0–52.0)
Hemoglobin: 11.9 g/dL — ABNORMAL LOW (ref 13.0–17.0)
MCH: 30.2 pg (ref 26.0–34.0)
MCHC: 30.7 g/dL (ref 30.0–36.0)
MCV: 98.2 fL (ref 80.0–100.0)
Platelets: 306 10*3/uL (ref 150–400)
RBC: 3.94 MIL/uL — ABNORMAL LOW (ref 4.22–5.81)
RDW: 13.6 % (ref 11.5–15.5)
WBC: 6.2 10*3/uL (ref 4.0–10.5)
nRBC: 0 % (ref 0.0–0.2)

## 2020-09-10 LAB — ECHOCARDIOGRAM COMPLETE
Area-P 1/2: 6.96 cm2
S' Lateral: 2.5 cm

## 2020-09-10 LAB — COMPREHENSIVE METABOLIC PANEL
ALT: 16 U/L (ref 0–44)
AST: 23 U/L (ref 15–41)
Albumin: 3.7 g/dL (ref 3.5–5.0)
Alkaline Phosphatase: 130 U/L — ABNORMAL HIGH (ref 38–126)
Anion gap: 9 (ref 5–15)
BUN: 13 mg/dL (ref 8–23)
CO2: 23 mmol/L (ref 22–32)
Calcium: 8.8 mg/dL — ABNORMAL LOW (ref 8.9–10.3)
Chloride: 106 mmol/L (ref 98–111)
Creatinine, Ser: 0.96 mg/dL (ref 0.61–1.24)
GFR, Estimated: >60 mL/min (ref >60)
Glucose, Bld: 120 mg/dL — ABNORMAL HIGH (ref 70–99)
Potassium: 3.7 mmol/L (ref 3.5–5.1)
Sodium: 138 mmol/L (ref 135–145)
Total Bilirubin: 1.1 mg/dL (ref 0.3–1.2)
Total Protein: 7.7 g/dL (ref 6.5–8.1)

## 2020-09-10 LAB — TSH: TSH: 5.556 u[IU]/mL — ABNORMAL HIGH (ref 0.350–4.500)

## 2020-09-10 LAB — PROTIME-INR
INR: 1.3 — ABNORMAL HIGH (ref 0.8–1.2)
Prothrombin Time: 15.6 seconds — ABNORMAL HIGH (ref 11.4–15.2)

## 2020-09-10 LAB — I-STAT CHEM 8, ED
BUN: 16 mg/dL (ref 8–23)
Calcium, Ion: 1.08 mmol/L — ABNORMAL LOW (ref 1.15–1.40)
Chloride: 103 mmol/L (ref 98–111)
Creatinine, Ser: 0.8 mg/dL (ref 0.61–1.24)
Glucose, Bld: 117 mg/dL — ABNORMAL HIGH (ref 70–99)
HCT: 40 % (ref 39.0–52.0)
Hemoglobin: 13.6 g/dL (ref 13.0–17.0)
Potassium: 3.7 mmol/L (ref 3.5–5.1)
Sodium: 139 mmol/L (ref 135–145)
TCO2: 22 mmol/L (ref 22–32)

## 2020-09-10 LAB — APTT: aPTT: 29 seconds (ref 24–36)

## 2020-09-10 LAB — RESPIRATORY PANEL BY RT PCR (FLU A&B, COVID)
Influenza A by PCR: NEGATIVE
Influenza B by PCR: NEGATIVE
SARS Coronavirus 2 by RT PCR: NEGATIVE

## 2020-09-10 LAB — CBG MONITORING, ED: Glucose-Capillary: 111 mg/dL — ABNORMAL HIGH (ref 70–99)

## 2020-09-10 MED ORDER — ACETAMINOPHEN 650 MG RE SUPP: 650.0000 mg | RECTAL | Status: DC | PRN

## 2020-09-10 MED ORDER — ATORVASTATIN CALCIUM 40 MG PO TABS
40.0000 mg | ORAL_TABLET | Freq: Every day | ORAL | Status: DC
  Administered 2020-09-10 – 2020-09-11 (×2): 40 mg via ORAL
  Filled 2020-09-10 (×2): qty 1

## 2020-09-10 MED ORDER — SODIUM CHLORIDE 0.9 % IV SOLN: INTRAVENOUS | Status: DC

## 2020-09-10 MED ORDER — SENNOSIDES-DOCUSATE SODIUM 8.6-50 MG PO TABS: 1.0000 | ORAL_TABLET | Freq: Every evening | ORAL | Status: DC | PRN

## 2020-09-10 MED ORDER — ASPIRIN 300 MG RE SUPP
300.0000 mg | Freq: Every day | RECTAL | Status: DC
  Administered 2020-09-10: 300 mg via RECTAL
  Filled 2020-09-10: qty 1

## 2020-09-10 MED ORDER — STROKE: EARLY STAGES OF RECOVERY BOOK
Freq: Once | Status: AC
  Filled 2020-09-10: qty 1

## 2020-09-10 MED ORDER — INSULIN ASPART 100 UNIT/ML ~~LOC~~ SOLN
0.0000 [IU] | Freq: Three times a day (TID) | SUBCUTANEOUS | Status: DC
  Administered 2020-09-11: 3 [IU] via SUBCUTANEOUS
  Administered 2020-09-11: 2 [IU] via SUBCUTANEOUS
  Administered 2020-09-12: 3 [IU] via SUBCUTANEOUS
  Administered 2020-09-12 – 2020-09-13 (×2): 2 [IU] via SUBCUTANEOUS

## 2020-09-10 MED ORDER — ASPIRIN 325 MG PO TABS
325.0000 mg | ORAL_TABLET | Freq: Every day | ORAL | Status: DC
  Administered 2020-09-11: 325 mg via ORAL
  Filled 2020-09-10 (×2): qty 1

## 2020-09-10 MED ORDER — ACETAMINOPHEN 160 MG/5ML PO SOLN: 650.0000 mg | ORAL | Status: DC | PRN

## 2020-09-10 MED ORDER — ACETAMINOPHEN 325 MG PO TABS
650.0000 mg | ORAL_TABLET | ORAL | Status: DC | PRN
  Administered 2020-09-11: 650 mg via ORAL
  Filled 2020-09-10: qty 2

## 2020-09-10 MED ORDER — APIXABAN 5 MG PO TABS
5.0000 mg | ORAL_TABLET | Freq: Two times a day (BID) | ORAL | Status: DC
  Filled 2020-09-10 (×2): qty 1

## 2020-09-10 NOTE — ED Notes (Signed)
Pt transported to 3W-23.

## 2020-09-10 NOTE — H&P (Addendum)
History and Physical    Jason Hunter KXF:[id number] DOB: 18-Oct-1928 DOA: 09/10/2020  PCP: Kirby Funk, MD Consultants:  None Patient coming from:  Home - lives with son; [place]:   Chief Complaint:  Possible stroke  HPI: Jason Hunter is a 86 y.o. male with medical history significant of HTN; HLD: afib on Eliquis; and DM presenting with L facial droop.  He was on the job this AM and he noticed losing his speech.  It was about 830 this AM and has not completely resolved.  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 No dysphagia.  He fatigues easily but attributes that to age.  No headache.  He did have a bit of visual disturbance - he seemed to be drinking on the white line and was concerned about that.  He has a large goiter, has had it for years, unchanged.    ED Course:  Needs stroke work-up - L weakness and facial droop.  On Eliquis for afib.  Neuro thinks maybe small vessel infarct.  Not a candidate for tPA.   Review of Systems: As per HPI; otherwise review of systems reviewed and negative.   Ambulatory Status:  Ambulates without assistance  COVID Vaccine Status:   Complete  Past Medical History:  Diagnosis Date  . Atrial fibrillation (HCC)   . Diabetes with neurologic complications (HCC)   . Dyslipidemia   . Essential hypertension     History reviewed. No pertinent surgical history.  Social History   Socioeconomic History  . Marital status: Widowed    Spouse name: Not on file  . Number of children: Not on file  . Years of education: Not on file  . Highest education level: Not on file  Occupational History  . 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 Smoking status: Never Smoker  . Smokeless tobacco: Never Used  Substance and Sexual Activity  . Alcohol use: Never  . Drug use: Never  . Sexual activity: Not on file  Other Topics Concern  . Not on file  Social History Narrative  . Not on file   Social Determinants of Health   Financial Resource  Strain:   . Difficulty of Paying Living Expenses: Not on file  Food Insecurity:   . Worried About Programme researcher, broadcasting/film/video in the Last Year: Not on file  . Ran Out of Food in the Last Year: Not on file  Transportation Needs:   . Lack of Transportation (Medical): Not on file  . Lack of Transportation (Non-Medical): Not on file  Physical Activity:   . Days of Exercise per Week: Not on file  . Minutes of Exercise per Session: Not on file  Stress:   . Feeling of Stress : Not on file  Social Connections:   . Frequency of Communication with Friends and Family: Not on file  . Frequency of Social Gatherings with Friends and Family: Not on file  . Attends Religious Services: Not on file  . Active Member of Clubs or Organizations: Not on file  . Attends Banker Meetings: Not on file  . Marital Status: Not on file  Intimate Partner Violence:   . Fear of Current or Ex-Partner: Not on file  . Emotionally Abused: Not on file  . Physically Abused: Not on file  . Sexually Abused: Not on file    No Known Allergies  History reviewed. No pertinent family history.  Prior to Admission medications   Medication Sig Start  Date End Date Taking? Authorizing Provider  amLODipine (NORVASC) 10 MG tablet Take 10 mg by mouth daily. 08/18/20  Yes [provider]  carvedilol (COREG) 25 MG tablet Take 25 mg by mouth 2 (two) times daily. 08/29/20  Yes [provider]  ELIQUIS 5 MG TABS tablet Take 5 mg by mouth 2 (two) times daily. 07/24/20  Yes [provider]  metFORMIN (GLUCOPHAGE-XR) 500 MG 24 hr tablet Take 500 mg by mouth 2 (two) times daily. 06/20/20  Yes [provider]  telmisartan (MICARDIS) 80 MG tablet Take 80 mg by mouth daily. 08/24/20  Yes [provider]    Physical Exam: Vitals:   09/10/20 1325 09/10/20 1325 09/10/20 1345 09/10/20 1445  BP: (!) 153/88 135/85 (!) 153/84 (!) 154/86  Pulse: (!) 102 85 (!) 107 89  Resp: (!) 23 (!) 27 (!) 22 14   Temp:  98.4 F (36.9 C)    TempSrc:  Oral    SpO2:  100% 99% 100%     . General:  Appears calm and comfortable and is NAD . Eyes:  R exotropia, normal lids, iris . ENT:  grossly normal hearing, lips & tongue, mmm, hallitosis; suboptimal dentition . Neck:  no LAD, masses or thyromegaly; large goiter . Cardiovascular:  RR with mild tachycardia, no m/r/g. No LE edema.  Marland Kitchen Respiratory:   CTA bilaterally with no wheezes/rales/rhonchi.  Mildly increased respiratory effort. . Abdomen:  soft, NT, ND, NABS . Skin:  no rash or induration seen on limited exam . Musculoskeletal:  grossly normal tone BUE/BLE, good ROM, no bony abnormality . Psychiatric:  grossly normal mood and affect, speech fluent and appropriate with subtle dysarthria, AOx3 . Neurologic:  CN 2-12 grossly intact, moves all extremities in coordinated fashion, sensation intact    Radiological Exams on Admission: Independently reviewed - see discussion in A/P where applicable  CT HEAD CODE STROKE WO CONTRAST  Result Date: 09/10/2020 CLINICAL DATA:  Code stroke.  Left facial droop and slurred speech. EXAM: CT HEAD WITHOUT CONTRAST TECHNIQUE: Contiguous axial images were obtained from the base of the skull through the vertex without intravenous contrast. COMPARISON:  None. FINDINGS: Brain: There is no evidence of an acute infarct, intracranial hemorrhage, mass, midline shift, or extra-axial fluid collection. The ventricles and sulci are within normal limits for age. Hypodensities in the cerebral white matter bilaterally are nonspecific but compatible with mild chronic small vessel ischemic disease. Vascular: Calcified atherosclerosis at the skull base. No hyperdense vessel. Skull: No fracture or suspicious osseous lesion. Sinuses/Orbits: Minimal mucosal thickening in the left maxillary sinus. Clear mastoid air cells. Bilateral cataract extraction. Other: None. ASPECTS [facility] Stroke Program Early CT Score) - Ganglionic level infarction  (caudate, lentiform nuclei, internal capsule, insula, M1-M3 cortex): 7 - Supraganglionic infarction (M4-M6 cortex): 3 Total score (0-10 with 10 being normal): 10 IMPRESSION: 1. No evidence of acute intracranial abnormality. 2. ASPECTS is 10. 3. Mild chronic small vessel ischemic disease. These results were communicated to Dr. Pearlean Brownie at 1:27 pm on 09/10/2020 by text page via the [facility] messaging system. Electronically Signed   By: Sebastian Ache M.D.   On: 09/10/2020 13:27    EKG: Independently reviewed.  Atrial fibrillation with rate 94; nonspecific ST changes with no evidence of acute ischemia   Labs on Admission: I have personally reviewed the available labs and imaging studies at the time of the admission.  Pertinent labs:   Glucose 120 AP 130 WBC 6.2 Hgb 11.9 INR 1.3 ETOH <10  Assessment/Plan Principal Problem:   TIA (transient ischemic attack) Active Problems:   Atrial fibrillation (HCC)   Diabetes with neurologic complications (HCC)   Dyslipidemia   Essential hypertension    Possible CVA -Patient who is an incredibly functional 86yo - still works for [employer] driving a dump truck -Presented today with L facial droop and mild dysarthria -Concerning for TIA/CVA -ABCD2 score is 6 -tPA can be given within 4.5 hours of symptom onset; this patient was not deemed to be a candidate for tPA therapy due to Eliquis -Aspirin has been given to reduce stroke mortality and decrease morbidity -Will place in observation status for CVA/TIA evaluation -Telemetry monitoring -MRI/MRA -Carotid dopplers; if ipsilateral carotid stenosis is detected then prompt vascular surgery consultation is needed for consideration of CEA. -Echo -Risk stratification with FLP, A1c; will also check TSH and UDS -Patient will need DAPT for 21 days when ABCD2 score is at least 4 and NIH score is 3 or less, and then can transition to monotherapy with a single antiplatelet agent.  Will defer to neurology for  now. -Consider thrombectomy if there is persistent disabling neurologic deficit associated with a vascular cut-off -Neurology consult -PT/OT/ST/Nutrition Consults  HTN -Allow permissive HTN for now -Treat BP only if >220/120, and then with goal of 15% reduction -Hold Norvasc, Coreg, and Micardis and plan to restart in 48-72 hours   HLD -Check FLP -He was previously taken off Pravachol by Dr. Valentina Lucks -Will start Lipitor 40 mg daily empirically for now   DM -Will check A1c -Hold home PO Glucophage -Will order moderate-scale SSI  Afib -Rate controlled with Coreg -Continue Eliquis   Note: This patient has been tested and is pending for the novel coronavirus COVID-19. He has been fully vaccinated against COVID-19.    DVT prophylaxis:  Eliquis Code Status: Full - confirmed with patient Family Communication: Patient cannot remember numbers and I tried multiple numbers in this chart and the linked one without success Disposition Plan:  The patient is from: home  Anticipated d/c is to: home without [facility] services  Anticipated d/c date will depend on clinical response to treatment, but possibly as early as tomorrow if he has excellent response to treatment  Patient is currently: acutely ill Consults called: Neurology; PT/OT/ST/Nutrition Admission status: It is my clinical opinion that referral for OBSERVATION is reasonable and necessary in this patient based on the above information provided. The aforementioned taken together are felt to place the patient at high risk for further clinical deterioration. However it is anticipated that the patient may be medically stable for discharge from the hospital within 24 to 48 hours.     Jonah Blue MD [facility]   How to contact the [facility] Attending or Consulting provider 7A - 7P or covering provider during after hours 7P -7A, for this patient?  1. Check the care team in [facility] and look for a) attending/consulting [facility] provider listed and  b) the [facility] team listed 2. Log into [email or web address] and use [place]'s universal password to access. If you do not have the password, please contact the hospital operator. 3. Locate the [facility] provider you are looking for under [facility] and page to a number that you can be directly reached. 4. If you still have difficulty reaching the provider, please page the [facility] (Director on Call) for the Hospitalists listed on amion for assistance.   09/10/2020, 3:18 PM

## 2020-09-10 NOTE — ED Notes (Signed)
Echo being conducted 

## 2020-09-10 NOTE — ED Notes (Signed)
Devontaye Ground, Son Cell [phone], Home [phone] wishes to be contated when father is assigned room or nay updates

## 2020-09-10 NOTE — Consult Note (Signed)
Reason for Consult:code stroke Referring Physician:Adam Curatolo  Jason Hunter is an 86 y.o. male with medical history significant of HTN; HLD: afib on Eliquis; and DM presenting as code stroke brought in by EMS with L facial droop.  He was on the job this AM and he noticed losing his speech.  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  He was met by the code stroke team upon arrival and was found to have slurred speech and facial droop with NIH stroke scale of 4.  He was not a TPA candidate as he had taken his morning dose of Eliquis and clinical exam was not suggestive of an LVO.  Noncontrast CT scan of the head was obtained which I personally reviewed showed ASPECT score of 10 and no acute abnormalities.  His blood pressure was significantly elevated upon arrival.  Patient will be admitted to the medical service and undergo stroke work-up.  He denies any prior history of strokes or TIAs.  He states he has been compliant with taking his Eliquis and has not missed any recent dosages. NIH stroke scale 4. IV TPA no as he took Eliquis IR candidate no as no LVO suspected  Past Medical History:  Diagnosis Date  . Atrial fibrillation ([place])   . Diabetes with neurologic complications ([place])   . Dyslipidemia   . Essential hypertension     History reviewed. No pertinent surgical history.  History reviewed. No pertinent family history.  Social History:  reports that he has never smoked. He has never used smokeless tobacco. He reports that he does not drink alcohol and does not use drugs.  Allergies: No Known Allergies  Medications: I have reviewed the patient's current medications.  Results for orders placed or performed during the hospital encounter of 09/10/20 (from the past 48 hour(s))  Ethanol     Status: None   Collection Time: 09/10/20  1:06  PM  Result Value Ref Range   Alcohol, Ethyl (B) <10 <10 mg/dL    Comment: (NOTE) Lowest detectable limit for serum alcohol is 10 mg/dL.  For medical purposes only. Performed at [facility], [place] [address], [place], [place] [address]   Protime-INR     Status: Abnormal   Collection Time: 09/10/20  1:06 PM  Result Value Ref Range   Prothrombin Time 15.6 (H) 11.4 - 15.2 seconds   INR 1.3 (H) 0.8 - 1.2    Comment: (NOTE) INR goal varies based on device and disease states. Performed at [facility], [place] [address] [place], [place] [address]   APTT     Status: None   Collection Time: 09/10/20  1:06 PM  Result Value Ref Range   aPTT 29 24 - 36 seconds    Comment: Performed at [place] [facility] [address] [place] [address]  CBC     Status: Abnormal   Collection Time: 09/10/20  1:06 PM  Result Value Ref Range   WBC 6.2 4.0 - 10.5 K/uL   RBC 3.94 (L) 4.22 - 5.81 MIL/uL   Hemoglobin 11.9 (L) 13.0 - 17.0 g/dL   HCT 38.7 (L) 39 - 52 %   MCV 98.2 80.0 - 100.0 fL   MCH 30.2 26.0 - 34.0 pg   MCHC 30.7 30.0 - 36.0 g/dL  RDW 13.6 11.5 - 15.5 %   Platelets 306 150 - 400 K/uL   nRBC 0.0 0.0 - 0.2 %    Comment: Performed at [facility], [place] [address], [place], [place] [address]  Differential     Status: None   Collection Time: 09/10/20  1:06 PM  Result Value Ref Range   Neutrophils Relative % 58 %   Neutro Abs 3.7 1.7 - 7.7 K/uL   Lymphocytes Relative 27 %   Lymphs Abs 1.7 0.7 - 4.0 K/uL   Monocytes Relative 13 %   Monocytes Absolute 0.8 0.1 - 1.0 K/uL   Eosinophils Relative 1 %   Eosinophils Absolute 0.0 0.0 - 0.5 K/uL   Basophils Relative 0 %   Basophils Absolute 0.0 0.0 - 0.1 K/uL   Immature Granulocytes 1 %   Abs Immature Granulocytes 0.05 0.00 - 0.07 K/uL    Comment: Performed at [facility] [address], [place], [place] [address]  Comprehensive metabolic panel     Status: Abnormal   Collection Time: 09/10/20  1:06 PM  Result  Value Ref Range   Sodium 138 135 - 145 mmol/L   Potassium 3.7 3.5 - 5.1 mmol/L   Chloride 106 98 - 111 mmol/L   CO2 23 22 - 32 mmol/L   Glucose, Bld 120 (H) 70 - 99 mg/dL    Comment: Glucose reference range applies only to samples taken after fasting for at least 8 hours.   BUN 13 8 - 23 mg/dL   Creatinine, Ser 0.96 0.61 - 1.24 mg/dL   Calcium 8.8 (L) 8.9 - 10.3 mg/dL   Total Protein 7.7 6.5 - 8.1 g/dL   Albumin 3.7 3.5 - 5.0 g/dL   AST 23 15 - 41 U/L   ALT 16 0 - 44 U/L   Alkaline Phosphatase 130 (H) 38 - 126 U/L   Total Bilirubin 1.1 0.3 - 1.2 mg/dL   GFR, Estimated >60 >60 mL/min    Comment: (NOTE) Calculated using the CKD-EPI Creatinine Equation (2021)    Anion gap 9 5 - 15    Comment: Performed at [facility] [address], [place], [place] [address]  CBG monitoring, ED     Status: Abnormal   Collection Time: 09/10/20  1:06 PM  Result Value Ref Range   Glucose-Capillary 111 (H) 70 - 99 mg/dL    Comment: Glucose reference range applies only to samples taken after fasting for at least 8 hours.  I-stat chem 8, ED     Status: Abnormal   Collection Time: 09/10/20  1:15 PM  Result Value Ref Range   Sodium 139 135 - 145 mmol/L   Potassium 3.7 3.5 - 5.1 mmol/L   Chloride 103 98 - 111 mmol/L   BUN 16 8 - 23 mg/dL   Creatinine, Ser 0.80 0.61 - 1.24 mg/dL   Glucose, Bld 117 (H) 70 - 99 mg/dL    Comment: Glucose reference range applies only to samples taken after fasting for at least 8 hours.   Calcium, Ion 1.08 (L) 1.15 - 1.40 mmol/L   TCO2 22 22 - 32 mmol/L   Hemoglobin 13.6 13.0 - 17.0 g/dL   HCT 40.0 39 - 52 %    CT HEAD CODE STROKE WO CONTRAST  Result Date: 09/10/2020 CLINICAL DATA:  Code stroke.  Left facial droop and slurred speech. EXAM: CT HEAD WITHOUT CONTRAST TECHNIQUE: Contiguous axial images were obtained from the base of the skull through the  vertex without intravenous contrast. COMPARISON:  None. FINDINGS: Brain: There is no evidence of an acute infarct,  intracranial hemorrhage, mass, midline shift, or extra-axial fluid collection. The ventricles and sulci are within normal limits for age. Hypodensities in the cerebral white matter bilaterally are nonspecific but compatible with mild chronic small vessel ischemic disease. Vascular: Calcified atherosclerosis at the skull base. No hyperdense vessel. Skull: No fracture or suspicious osseous lesion. Sinuses/Orbits: Minimal mucosal thickening in the left maxillary sinus. Clear mastoid air cells. Bilateral cataract extraction. Other: None. ASPECTS [facility] Stroke Program Early CT Score) - Ganglionic level infarction (caudate, lentiform nuclei, internal capsule, insula, M1-M3 cortex): 7 - Supraganglionic infarction (M4-M6 cortex): 3 Total score (0-10 with 10 being normal): 10 IMPRESSION: 1. No evidence of acute intracranial abnormality. 2. ASPECTS is 10. 3. Mild chronic small vessel ischemic disease. These results were communicated to Dr. Leonie Man at 1:27 pm on 09/10/2020 by text page via the [facility] messaging system. Electronically Signed   By: Logan Bores M.D.   On: 09/10/2020 13:27      ROS Blood pressure (!) 154/86, pulse 89, temperature 98.4 F (36.9 C), temperature source Oral, resp. rate 14, SpO2 100 %. Physical Exam Pleasant elderly African-American male not in distress. . Afebrile. Head is nontraumatic. Neck is supple without bruit.    Cardiac exam no murmur or gallop. Lungs are clear to auscultation. Distal pulses are well felt. Neurological Exam : Awake alert oriented to time place and person.  Speech is moderately dysarthric.  Extraocular movements are full range without nystagmus.  No aphasia.  Blinks to threat bilaterally.  Not very cooperative for visual field testing accurately but blinks to threat on both sides.  Moderate left lower facial weakness.  Tongue midline.  Motor system exam no upper or lower extremity drift but diminished fine finger movements on the left.  Mild weakness of left grip  compared to right.  Orbits right over left upper extremity.  No lower extremity drift but slightly left hip flexor weakness on double simultaneous testing in lower extremities.  Sensation is intact bilaterally.  Gait not tested.  NIH stroke scale 4.  Premorbid baseline modified Rankin 0 Assessment : 86 year old African-American male with sudden onset of slurred speech and left facial droop due to right brain subcortical infarct likely from small vessel disease though cardiogenic embolism also possible due to his longstanding atrial fibrillation despite being on anticoagulation with Eliquis. Vascular risk factors of diabetes, hypertension, hyperlipidemia, atrial fibrillation and age Plan: Admit to medical service for further stroke evaluation.  Check MRI scan of the brain, MRA of the brain, carotid ultrasound, transthoracic echocardiogram, lipid profile, hemoglobin A1c. Check bedside swallow eval and if he passes resume home dose of Eliquis.  Mobilize out of bed.  Physical occupational speech therapy consults.  Stroke team will follow.  Kindly call for questions. Discussed with Dr. Ronnald Nian ER physician. Greater than 50% time during this 80-minute consultation visit was spent on counseling and coordination of care about his stroke and discussion about evaluation and treatment plan and discussion with care team and answering questions Antony Contras 09/10/2020, 3:21 PM    Note: This document was prepared with digital dictation and possible smart phrase technology. Any transcriptional errors that result from this process are unintentional.

## 2020-09-10 NOTE — ED Notes (Signed)
Dinner Tray Ordered @ 1747. 

## 2020-09-10 NOTE — Progress Notes (Signed)
  Echocardiogram 2D Echocardiogram has been performed.  Celene Skeen 09/10/2020, 3:54 PM

## 2020-09-10 NOTE — ED Provider Notes (Addendum)
[facility] EMERGENCY DEPARTMENT Provider Note   CSN: [id number] Arrival date & time: 09/10/20  1303  An emergency department physician performed an initial assessment on this suspected stroke patient at 1304.  History Chief Complaint  Patient presents with  . Code Stroke    Aydon Swamy is a 86 y.o. male.  Patient with slurred speech, left-sided facial droop since 830 this morning.  Is on Eliquis but does not know if it is for A. fib or not.  States no history of heart attack or stroke.  No history of blood clots.  Patient denies any vision problems, chest pain, shortness of breath.  The history is provided by the patient and the EMS personnel.  Neurologic Problem This is a new problem. The current episode started 3 to 5 hours ago. The problem occurs constantly. The problem has not changed since onset.Pertinent negatives include no chest pain, no abdominal pain, no headaches and no shortness of breath. Nothing aggravates the symptoms. Nothing relieves the symptoms. He has tried nothing for the symptoms. The treatment provided no relief.       No past medical history on file.  There are no problems to display for this patient.   PMH: Hypertension, high cholesterol, diabetes     No family history on file.  Social History   Tobacco Use  . Smoking status: Not on file  Substance Use Topics  . Alcohol use: Not on file  . Drug use: Not on file    Home Medications Prior to Admission medications   Medication Sig Start Date End Date Taking? Authorizing Provider  amLODipine (NORVASC) 10 MG tablet Take 10 mg by mouth daily. 08/18/20   [provider]  carvedilol (COREG) 25 MG tablet Take 25 mg by mouth 2 (two) times daily. 08/29/20   [provider]  chlorthalidone (HYGROTON) 25 MG tablet Take 25 mg by mouth every morning. 05/23/20   [provider]  ELIQUIS 5 MG TABS tablet Take 5 mg by mouth 2 (two) times daily. 07/24/20    [provider]  metFORMIN (GLUCOPHAGE-XR) 500 MG 24 hr tablet Take 500 mg by mouth 2 (two) times daily. 06/20/20   [provider]  potassium chloride SA (KLOR-CON) 20 MEQ tablet Take 20 mEq by mouth daily. 07/31/20   [provider]  telmisartan (MICARDIS) 80 MG tablet Take 80 mg by mouth daily. 08/24/20   [provider]    Allergies    Patient has no allergy information on record.  Review of Systems   Review of Systems  Constitutional: Negative for chills and fever.  HENT: Negative for ear pain and sore throat.   Eyes: Negative for pain and visual disturbance.  Respiratory: Negative for cough and shortness of breath.   Cardiovascular: Negative for chest pain and palpitations.  Gastrointestinal: Negative for abdominal pain and vomiting.  Genitourinary: Negative for dysuria and hematuria.  Musculoskeletal: Negative for arthralgias and back pain.  Skin: Negative for color change and rash.  Neurological: Positive for facial asymmetry, speech difficulty (facial droop) and weakness (left facial weakness). Negative for dizziness, seizures, syncope, light-headedness, numbness and headaches.  All other systems reviewed and are negative.   Physical Exam Updated Vital Signs  ED Triage Vitals  Enc Vitals Group     BP 09/10/20 1325 (!) 153/88     Pulse Rate 09/10/20 1325 (!) 102     Resp 09/10/20 1325 (!) 23     Temp 09/10/20 1325 98.4 F (36.9 C)  Temp Source 09/10/20 1325 Oral     SpO2 09/10/20 1325 100 %     Weight --      Height --      Head Circumference --      Peak Flow --      Pain Score 09/10/20 1325 0     Pain Loc --      Pain Edu? --      Excl. in GC? --     Physical Exam Vitals and nursing note reviewed.  Constitutional:      General: He is not in acute distress.    Appearance: He is well-developed. He is not ill-appearing.  HENT:     Head: Normocephalic and atraumatic.     Nose: Nose normal.     Mouth/Throat:     Mouth:  Mucous membranes are moist.  Eyes:     Extraocular Movements: Extraocular movements intact.     Conjunctiva/sclera: Conjunctivae normal.     Pupils: Pupils are equal, round, and reactive to light.  Cardiovascular:     Rate and Rhythm: Normal rate and regular rhythm.     Pulses: Normal pulses.     Heart sounds: Normal heart sounds. No murmur heard.   Pulmonary:     Effort: Pulmonary effort is normal. No respiratory distress.     Breath sounds: Normal breath sounds.  Abdominal:     Palpations: Abdomen is soft.     Tenderness: There is no abdominal tenderness.  Musculoskeletal:        General: No tenderness.     Cervical back: Neck supple.  Skin:    General: Skin is warm and dry.     Capillary Refill: Capillary refill takes less than 2 seconds.  Neurological:     Mental Status: He is alert and oriented to person, place, and time.     Comments: Mild left facial droop, mild weakness in the left upper and left lower extremity 4+ out of 5 but otherwise 5+ out of 5 strength, normal sensation slight drift on the left compared to the right, slurred speech, limb ataxia in the left upper extremity when performing finger-to-nose finger when compared to the right     ED Results / Procedures / Treatments   Labs (all labs ordered are listed, but only abnormal results are displayed) Labs Reviewed  PROTIME-INR - Abnormal; Notable for the following components:      Result Value   Prothrombin Time 15.6 (*)    INR 1.3 (*)    All other components within normal limits  CBC - Abnormal; Notable for the following components:   RBC 3.94 (*)    Hemoglobin 11.9 (*)    HCT 38.7 (*)    All other components within normal limits  COMPREHENSIVE METABOLIC PANEL - Abnormal; Notable for the following components:   Glucose, Bld 120 (*)    Calcium 8.8 (*)    Alkaline Phosphatase 130 (*)    All other components within normal limits  I-STAT CHEM 8, ED - Abnormal; Notable for the following components:    Glucose, Bld 117 (*)    Calcium, Ion 1.08 (*)    All other components within normal limits  CBG MONITORING, ED - Abnormal; Notable for the following components:   Glucose-Capillary 111 (*)    All other components within normal limits  RESPIRATORY PANEL BY RT PCR (FLU A&B, COVID)  ETHANOL  APTT  DIFFERENTIAL  RAPID URINE DRUG SCREEN, HOSP PERFORMED  URINALYSIS, ROUTINE W REFLEX MICROSCOPIC  EKG EKG Interpretation  Date/Time:  Thursday September 10 2020 13:47:22 EST Ventricular Rate:  94 PR Interval:    QRS Duration: 89 QT Interval:  345 QTC Calculation: 432 R Axis:   114 Text Interpretation: Atrial fibrillation Anterior infarct, old Confirmed by Virgina Norfolk [phone]) on 09/10/2020 1:51:40 PM   Radiology CT HEAD CODE STROKE WO CONTRAST  Result Date: 09/10/2020 CLINICAL DATA:  Code stroke.  Left facial droop and slurred speech. EXAM: CT HEAD WITHOUT CONTRAST TECHNIQUE: Contiguous axial images were obtained from the base of the skull through the vertex without intravenous contrast. COMPARISON:  None. FINDINGS: Brain: There is no evidence of an acute infarct, intracranial hemorrhage, mass, midline shift, or extra-axial fluid collection. The ventricles and sulci are within normal limits for age. Hypodensities in the cerebral white matter bilaterally are nonspecific but compatible with mild chronic small vessel ischemic disease. Vascular: Calcified atherosclerosis at the skull base. No hyperdense vessel. Skull: No fracture or suspicious osseous lesion. Sinuses/Orbits: Minimal mucosal thickening in the left maxillary sinus. Clear mastoid air cells. Bilateral cataract extraction. Other: None. ASPECTS [facility] Stroke Program Early CT Score) - Ganglionic level infarction (caudate, lentiform nuclei, internal capsule, insula, M1-M3 cortex): 7 - Supraganglionic infarction (M4-M6 cortex): 3 Total score (0-10 with 10 being normal): 10 IMPRESSION: 1. No evidence of acute intracranial abnormality. 2.  ASPECTS is 10. 3. Mild chronic small vessel ischemic disease. These results were communicated to Dr. Pearlean Brownie at 1:27 pm on 09/10/2020 by text page via the [facility] messaging system. Electronically Signed   By: Sebastian Ache M.D.   On: 09/10/2020 13:27    Procedures .Critical Care Performed by: Virgina Norfolk, DO Authorized by: Virgina Norfolk, DO   Critical care provider statement:    Critical care time (minutes):  35   Critical care was necessary to treat or prevent imminent or life-threatening deterioration of the following conditions:  CNS failure or compromise   Critical care was time spent personally by me on the following activities:  Discussions with consultants, evaluation of patient's response to treatment, examination of patient, ordering and performing treatments and interventions, ordering and review of laboratory studies, ordering and review of radiographic studies, pulse oximetry, re-evaluation of patient's condition, obtaining history from patient or surrogate, review of old charts and discussions with primary provider   I assumed direction of critical care for this patient from another provider in my specialty: no     (including critical care time)  Medications Ordered in ED Medications - No data to display  ED Course  I have reviewed the triage vital signs and the nursing notes.  Pertinent labs & imaging results that were available during my care of the patient were reviewed by me and considered in my medical decision making (see chart for details).    MDM Rules/Calculators/A&P                          Kaevon Cotta is a 86 year old male with history of hypertension, diabetes, on Eliquis for A. fib who presents to the ED with strokelike symptoms.  Code stroke upon arrival.  However he appears to be LVO negative and last known normal was 8:30 PM.  Still went directly to the CT scan with neurology where he had a negative noncontrasted head CT.  Patient with subtle left-sided  weakness, slurred speech, left-sided facial droop.  Suspect patient has had stroke per neurology.  Lab work overall unremarkable.  Will admit to medicine for further stroke  work-up.  Patient is not a candidate for TPA given that he is on Eliquis and exam is not consistent with a large vessel occlusion therefore patient is not a candidate for thrombectomy. Hemodynamically stable for my care and to be admitted to medicine for further stroke work-up.  This chart was dictated using voice recognition software.  Despite best efforts to proofread,  errors can occur which can change the documentation meaning.    Final Clinical Impression(s) / ED Diagnoses Final diagnoses:  Cerebrovascular accident (CVA), unspecified mechanism [facility](HCC)    Rx / DC Orders ED Discharge Orders    None       Virgina NorfolkCuratolo, Malique Driskill, DO 09/10/20 1346    Virgina Norfolkuratolo, Jhordan Kinter, DO 09/10/20 1352

## 2020-09-10 NOTE — ED Notes (Signed)
Pt has been on bed said  toilet for a while but is A & O, But pt completely unhooked self.

## 2020-09-10 NOTE — ED Notes (Signed)
Pt did fairly well with stroke swallow screen. However, Pt face asymetrical and a little water rolled out of mouth on neck. Pt did NOT cough or choke. Howev3er failed based on first 2 observations

## 2020-09-10 NOTE — Code Documentation (Signed)
Stroke Response Nurse Documentation Code Documentation  Jason Hunter is a 86 y.o. male arriving to [facility] ED via [facility] on 09/10/2020 with past medical hx unknown on Eliquis. Code stroke was activated by EMS. Patient from where he was LKW at 0830 when he went to work. Pt noted that he started to have slurred speech and some trouble with his left sided. 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 Now complaining of left facial droop and slurred speech . On Eliquis (apixaban) daily.    Stroke team at the bedside on patient arrival. Labs drawn and patient cleared for CT by Dr. Lockie Mola. Patient to CT with team. NIHSS 4, see documentation for details and code stroke times. Patient with left facial droop, left limb ataxia and dysarthria  on exam. The following imaging was completed:  CT. Patient is not a candidate for tPA due to contraindication with Eliquis. Care/Plan: q2 mNIHSS/VS. Bedside handoff with ED RN Freida Busman .    Lucila Maine  Stroke Response RN

## 2020-09-10 NOTE — ED Notes (Signed)
Off floor to MRI

## 2020-09-11 ENCOUNTER — Observation Stay (HOSPITAL_COMMUNITY): Payer: Medicare Other

## 2020-09-11 DIAGNOSIS — G459 Transient cerebral ischemic attack, unspecified: Secondary | ICD-10-CM

## 2020-09-11 DIAGNOSIS — I639 Cerebral infarction, unspecified: Secondary | ICD-10-CM

## 2020-09-11 DIAGNOSIS — T83098A Other mechanical complication of other indwelling urethral catheter, initial encounter: Secondary | ICD-10-CM | POA: Diagnosis not present

## 2020-09-11 DIAGNOSIS — R339 Retention of urine, unspecified: Secondary | ICD-10-CM | POA: Diagnosis not present

## 2020-09-11 DIAGNOSIS — R310 Gross hematuria: Secondary | ICD-10-CM | POA: Diagnosis present

## 2020-09-11 DIAGNOSIS — I4891 Unspecified atrial fibrillation: Secondary | ICD-10-CM | POA: Diagnosis present

## 2020-09-11 DIAGNOSIS — I10 Essential (primary) hypertension: Secondary | ICD-10-CM | POA: Diagnosis present

## 2020-09-11 DIAGNOSIS — E119 Type 2 diabetes mellitus without complications: Secondary | ICD-10-CM | POA: Diagnosis present

## 2020-09-11 DIAGNOSIS — Z9114 Patient's other noncompliance with medication regimen: Secondary | ICD-10-CM | POA: Diagnosis not present

## 2020-09-11 DIAGNOSIS — Y92239 Unspecified place in hospital as the place of occurrence of the external cause: Secondary | ICD-10-CM | POA: Diagnosis not present

## 2020-09-11 DIAGNOSIS — R338 Other retention of urine: Secondary | ICD-10-CM | POA: Diagnosis not present

## 2020-09-11 DIAGNOSIS — I69354 Hemiplegia and hemiparesis following cerebral infarction affecting left non-dominant side: Secondary | ICD-10-CM | POA: Diagnosis not present

## 2020-09-11 DIAGNOSIS — E049 Nontoxic goiter, unspecified: Secondary | ICD-10-CM | POA: Diagnosis present

## 2020-09-11 DIAGNOSIS — Z20822 Contact with and (suspected) exposure to covid-19: Secondary | ICD-10-CM | POA: Diagnosis present

## 2020-09-11 DIAGNOSIS — R29704 NIHSS score 4: Secondary | ICD-10-CM | POA: Diagnosis present

## 2020-09-11 DIAGNOSIS — Z7984 Long term (current) use of oral hypoglycemic drugs: Secondary | ICD-10-CM | POA: Diagnosis not present

## 2020-09-11 DIAGNOSIS — E785 Hyperlipidemia, unspecified: Secondary | ICD-10-CM | POA: Diagnosis present

## 2020-09-11 DIAGNOSIS — H539 Unspecified visual disturbance: Secondary | ICD-10-CM | POA: Diagnosis present

## 2020-09-11 DIAGNOSIS — E7800 Pure hypercholesterolemia, unspecified: Secondary | ICD-10-CM | POA: Diagnosis present

## 2020-09-11 DIAGNOSIS — R29810 Facial weakness: Secondary | ICD-10-CM | POA: Diagnosis present

## 2020-09-11 DIAGNOSIS — R471 Dysarthria and anarthria: Secondary | ICD-10-CM | POA: Diagnosis present

## 2020-09-11 DIAGNOSIS — I63411 Cerebral infarction due to embolism of right middle cerebral artery: Secondary | ICD-10-CM | POA: Diagnosis present

## 2020-09-11 DIAGNOSIS — Z7901 Long term (current) use of anticoagulants: Secondary | ICD-10-CM | POA: Diagnosis not present

## 2020-09-11 DIAGNOSIS — Z79899 Other long term (current) drug therapy: Secondary | ICD-10-CM | POA: Diagnosis not present

## 2020-09-11 DIAGNOSIS — Y848 Other medical procedures as the cause of abnormal reaction of the patient, or of later complication, without mention of misadventure at the time of the procedure: Secondary | ICD-10-CM | POA: Diagnosis not present

## 2020-09-11 LAB — GLUCOSE, CAPILLARY
Glucose-Capillary: 161 mg/dL — ABNORMAL HIGH (ref 70–99)
Glucose-Capillary: 149 mg/dL — ABNORMAL HIGH (ref 70–99)
Glucose-Capillary: 109 mg/dL — ABNORMAL HIGH (ref 70–99)
Glucose-Capillary: 151 mg/dL — ABNORMAL HIGH (ref 70–99)

## 2020-09-11 LAB — LIPID PANEL
Cholesterol: 137 mg/dL (ref 0–200)
HDL: 55 mg/dL (ref >40)
LDL Cholesterol: 75 mg/dL (ref 0–99)
Total CHOL/HDL Ratio: 2.5 RATIO
Triglycerides: 35 mg/dL (ref <150)
VLDL: 7 mg/dL (ref 0–40)

## 2020-09-11 LAB — T4, FREE: Free T4: 0.87 ng/dL (ref 0.61–1.12)

## 2020-09-11 LAB — HEMOGLOBIN A1C
Hgb A1c MFr Bld: 6 % — ABNORMAL HIGH (ref 4.8–5.6)
Mean Plasma Glucose: 125.5 mg/dL

## 2020-09-11 MED ORDER — SODIUM CHLORIDE 0.9 % IR SOLN: 3000.0000 mL | Status: DC

## 2020-09-11 MED ORDER — ATORVASTATIN CALCIUM 10 MG PO TABS
10.0000 mg | ORAL_TABLET | Freq: Every day | ORAL | Status: DC
  Administered 2020-09-12 – 2020-09-13 (×2): 10 mg via ORAL
  Filled 2020-09-11 (×2): qty 1

## 2020-09-11 NOTE — Progress Notes (Signed)
Attempted to in and out cath pt and received copious amounts of blood clots with no urine return. He had been going backand forth to the restroom and only able to dribble. I performed a urine scan and pt had of urine. Pt has refused Eliquis and dr Frederick Peers was notified.

## 2020-09-11 NOTE — Assessment & Plan Note (Signed)
-   A1c 6% - continue SSI and CBGs

## 2020-09-11 NOTE — Evaluation (Signed)
Speech Language Pathology Evaluation Patient Details Name: Jason Hunter MRN: [id number] DOB: 06-26-1928 Today's Date: 09/11/2020 Time: 9563-8756 SLP Time Calculation (min) (ACUTE ONLY): 18 min  Problem List:  Patient Active Problem List   Diagnosis Date Noted  . TIA (transient ischemic attack) 09/10/2020  . Atrial fibrillation (HCC)   . Diabetes with neurologic complications (HCC)   . Dyslipidemia   . Essential hypertension    Past Medical History:  Past Medical History:  Diagnosis Date  . Atrial fibrillation (HCC)   . Diabetes with neurologic complications (HCC)   . Dyslipidemia   . Essential hypertension    Past Surgical History: History reviewed. No pertinent surgical history. HPI:  86 y.o. male with medical history significant of HTN; HLD: afib on Eliquis; and DM presenting with L facial droop.   MRI brain on 09/09/20 indicated Patchy acute Right MCA territory infarcts, including at the lateral right motor strip. No associated hemorrhage or mass effect.   Assessment / Plan / Recommendation Clinical Impression  Pt seen for a speech/language cognitive assessment (SLUMS-[employer] Mental Status Examination) which yielded a score of 15/30 with normal score being 27/30 with deficits noted in the areas of attention, auditory comprehension (50% with questions from short story) and memory (recalled 2/5 words after delay).  Speech intelligibility judged to be 50-75% accurate within words-sentences and decreased to 25% with conversation and increased rate.  With pacing and use of slow, precise, deliberate articulation, he was able to increase intelligibility to 75% accuracy.  Baseline functioning not known due to no family available during SLE; pt's education level is 9th grade, so literacy baseline unknown as well.  Pt able to read larger print (wears glasses), but visual acuity questionable vs literacy level prior to CVA.  Recommend ST f/u while in acute setting and HH SLP  once discharged to focus on dysarthria and cognitive deficits.  Thank you for this consult.     SLP Assessment  SLP Recommendation/Assessment: Patient needs continued Speech Language Pathology Services SLP Visit Diagnosis: Dysarthria and anarthria (R47.1);Cognitive communication deficit (R41.841)    Follow Up Recommendations  Home health SLP    Frequency and Duration min 2x/week  1 week      SLP Evaluation Cognition  Overall Cognitive Status: Impaired/Different from baseline Arousal/Alertness: Awake/alert Orientation Level: Oriented X4 Attention: Sustained Sustained Attention: Impaired Sustained Attention Impairment: Verbal basic;Functional basic Memory: Impaired Memory Impairment: Retrieval deficit Immediate Memory Recall: Sock;Blue;Bed Memory Recall Sock: Without Cue Memory Recall Blue: Without Cue Memory Recall Bed: With Cue Safety/Judgment: Impaired Comments: Feels he can go back to work without difficulty       Comprehension  Auditory Comprehension Overall Auditory Comprehension: Impaired Yes/No Questions: Within Functional Limits Commands: Not tested Conversation: Simple Interfering Components: Attention;Working memory EffectiveTechniques: Extra processing time;Repetition;Slowed speech Visual Recognition/Discrimination Discrimination: Exceptions to [facility] (unsure about baseline literacy level) Reading Comprehension Reading Status: Impaired Word level: Unable to assess (comment) (baseline literacy level unknown) Functional Environmental (signs, name badge): Impaired Interfering Components: Visual acuity Effective Techniques: Large print    Expression Expression Primary Mode of Expression: Verbal Verbal Expression Overall Verbal Expression: Appears within functional limits for tasks assessed Initiation: No impairment Level of Generative/Spontaneous Verbalization: Conversation Repetition: No impairment Naming: No impairment Interfering Components: Speech  intelligibility Non-Verbal Means of Communication: Not applicable Written Expression Dominant Hand: Right Written Expression: Unable to assess (comment)   Oral / Motor  Oral Motor/Sensory Function Overall Oral Motor/Sensory Function: Mild impairment Facial ROM: Reduced left Facial Symmetry: Abnormal symmetry left Facial  Strength: Reduced left Facial Sensation: Reduced left Lingual ROM: Reduced left Lingual Symmetry: Abnormal symmetry left Lingual Strength: Reduced Lingual Sensation: Within Functional Limits Motor Speech Overall Motor Speech: Appears within functional limits for tasks assessed Respiration: Impaired Level of Impairment: Sentence Phonation: Low vocal intensity Resonance: Within functional limits Articulation: Impaired Level of Impairment: Phrase Intelligibility: Intelligibility reduced Word: 50-74% accurate Phrase: 50-74% accurate Sentence: 50-74% accurate Conversation: 25-49% accurate Motor Planning: Witnin functional limits Motor Speech Errors: Not applicable Effective Techniques: Slow rate;Increased vocal intensity;Over-articulate;Pause                       Tressie Stalker, M.S., CCC-SLP 09/11/2020, 12:52 PM

## 2020-09-11 NOTE — Progress Notes (Signed)
Carotid duplex has been completed.   Preliminary results in CV Proc.   Blanch Media 09/11/2020 10:56 AM

## 2020-09-11 NOTE — Hospital Course (Addendum)
Jason Hunter is a 86 y.o. male with medical history significant of HTN; HLD, afib on Eliquis, and DM presented with L facial droop.   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   No dysphagia.  He fatigues easily but attributes that to age.  No headache.  He did have a bit of visual disturbance.  He has a large goiter, has had it for years, unchanged.    He underwent MRI brain which revealed patchy acute Right MCA territory infarcts, including at the lateral right motor strip. No associated hemorrhage or mass effect. MRA brain was normal. Carotid ultrasound was also unreavealing for significant stenosis.  Echo was also unremarkable, mildly elevated right side pressures but EF normal and no diastolic dysfunction.  He was recommended to continue on Eliquis.  He was also started on low-dose Lipitor.  He will follow-up with neurology at discharge as well.  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  He was in understanding of this.  On evening of 09/11/2020 he developed urinary retention and unsuccessful Foley catheter placements.  Urology was consulted and performed a bedside cystoscopy.  Urethra had traumatic Foley signs and and indwelling catheter was able to be placed successfully over guidewire.  He had clearing of urine after catheter placed but then developed further clots.  He was started on intermittent irrigation. Ultimately his urine cleared with no further clots. He did have some expected drops of blood from around foley/urethra from previous traumatic foley. This was discussed with urology prior to discharge and also in agreement.  Patient was considered stable for discharging home with outpatient follow-up with urology as anticipated.  Foley catheter maintained in place at discharge.

## 2020-09-11 NOTE — Assessment & Plan Note (Addendum)
-   continue statin - LDL 75

## 2020-09-11 NOTE — Progress Notes (Signed)
STROKE TEAM PROGRESS NOTE   INTERVAL HISTORY States he is doing very well. He noted a spot of red blood in his stool in the last 3 days and he "went light" on his Eliquis. Discussed always taking it twice daily and calling physician prior to any medication adjustments.   Vitals:   09/10/20 2052 09/10/20 2358 09/11/20 0412 09/11/20 0800  BP: (!) 156/98 (!) 143/78 (!) 152/92 (!) 157/82  Pulse: 94 83 100 99  Resp: Temp: 98.3 F (36.8 C) 98.1 F (36.7 C) 98.5 F (36.9 C) 98.4 F (36.9 C)  TempSrc: Oral Oral Oral Oral  SpO2: 98% 98% 99% 100%  Weight:      Height:       CBC:  Recent Labs  Lab 09/10/20 1306 09/10/20 1315  WBC 6.2  --   NEUTROABS 3.7  --   HGB 11.9* 13.6  HCT 38.7* 40.0  MCV 98.2  --   PLT 306  --    Basic Metabolic Panel:  Recent Labs  Lab 09/10/20 1306 09/10/20 1315  NA 138 139  K 3.7 3.7  CL 106 103  CO2 23  --   GLUCOSE 120* 117*  BUN 13 16  CREATININE 0.96 0.80  CALCIUM 8.8*  --    Lipid Panel:  Recent Labs  Lab 09/11/20 0319  CHOL 137  TRIG 35  HDL 55  CHOLHDL 2.5  VLDL 7  LDLCALC 75   HgbA1c:  Recent Labs  Lab 09/11/20 0319  HGBA1C 6.0*   Urine Drug Screen:  Recent Labs  Lab 09/10/20 1724  LABOPIA NONE DETECTED  COCAINSCRNUR NONE DETECTED  LABBENZ NONE DETECTED  AMPHETMU NONE DETECTED  THCU NONE DETECTED  LABBARB NONE DETECTED    Alcohol Level  Recent Labs  Lab 09/10/20 1306  ETH <10    IMAGING past 24 hours MR ANGIO HEAD WO CONTRAST  Result Date: 09/10/2020 CLINICAL DATA:  86 year old male with atrial fibrillation on Eliquis. Code stroke presentation earlier today. Left facial droop and slurred speech. EXAM: MRI HEAD WITHOUT CONTRAST MRA HEAD WITHOUT CONTRAST TECHNIQUE: Multiplanar, multiecho pulse sequences of the brain and surrounding structures were obtained without intravenous contrast. Angiographic images of the head were obtained using MRA technique without contrast. COMPARISON:  Plain head CT  1312 hours today. FINDINGS: MRI HEAD FINDINGS Brain: Multifocal patchy right MCA territory infarct affecting the lateral right motor strip and anterior right operculum. See series 4, image 34, series 6, image 18). No contralateral or other restricted diffusion. Minimal T2 and FLAIR hyperintensity at the affected parenchyma. No hemorrhage or mass effect. No chronic cortical encephalomalacia identified. Mild for age patchy bilateral white matter T2 and FLAIR hyperintensity. No midline shift, mass effect, evidence of mass lesion, ventriculomegaly, extra-axial collection or acute intracranial hemorrhage. Cervicomedullary junction and pituitary are within normal limits. Vascular: Major intracranial vascular flow voids are preserved. Skull and upper cervical spine: Negative for age visible cervical spine. Visualized bone marrow signal is within normal limits. Sinuses/Orbits: Postoperative changes to both globes. Otherwise negative orbits. Paranasal sinuses and mastoids are stable and well pneumatized. Other: Grossly normal visible internal auditory structures. Negative visible scalp and face. MRA HEAD FINDINGS Antegrade flow in the posterior circulation with fairly codominant distal vertebral arteries. No distal vertebral stenosis. Patent left PICA origin, vertebrobasilar junction, dominant appearing right AICA. Patent basilar artery. Patent SCA and PCA origins. Posterior communicating arteries are diminutive or absent. Bilateral PCA branches are within normal limits. Antegrade flow in both ICA  siphons. No siphon stenosis. Patent carotid termini. Normal MCA and ACA origins. Diminutive or absent anterior communicating artery. MCA M1 segments and both MCA bifurcations are patent without stenosis. On the right no MCA branch occlusion is identified. Visible left MCA and bilateral ACA branches are within normal limits. IMPRESSION: 1. Patchy acute Right MCA territory infarcts, including at the lateral right motor strip. No  associated hemorrhage or mass effect. 2. No Right MCA branch occlusion identified, intracranial MRA is negative for age. Electronically Signed   By: Odessa Fleming M.D.   On: 09/10/2020 18:40   MR BRAIN WO CONTRAST  Result Date: 09/10/2020 CLINICAL DATA:  86 year old male with atrial fibrillation on Eliquis. Code stroke presentation earlier today. Left facial droop and slurred speech. EXAM: MRI HEAD WITHOUT CONTRAST MRA HEAD WITHOUT CONTRAST TECHNIQUE: Multiplanar, multiecho pulse sequences of the brain and surrounding structures were obtained without intravenous contrast. Angiographic images of the head were obtained using MRA technique without contrast. COMPARISON:  Plain head CT 1312 hours today. FINDINGS: MRI HEAD FINDINGS Brain: Multifocal patchy right MCA territory infarct affecting the lateral right motor strip and anterior right operculum. See series 4, image 34, series 6, image 18). No contralateral or other restricted diffusion. Minimal T2 and FLAIR hyperintensity at the affected parenchyma. No hemorrhage or mass effect. No chronic cortical encephalomalacia identified. Mild for age patchy bilateral white matter T2 and FLAIR hyperintensity. No midline shift, mass effect, evidence of mass lesion, ventriculomegaly, extra-axial collection or acute intracranial hemorrhage. Cervicomedullary junction and pituitary are within normal limits. Vascular: Major intracranial vascular flow voids are preserved. Skull and upper cervical spine: Negative for age visible cervical spine. Visualized bone marrow signal is within normal limits. Sinuses/Orbits: Postoperative changes to both globes. Otherwise negative orbits. Paranasal sinuses and mastoids are stable and well pneumatized. Other: Grossly normal visible internal auditory structures. Negative visible scalp and face. MRA HEAD FINDINGS Antegrade flow in the posterior circulation with fairly codominant distal vertebral arteries. No distal vertebral stenosis. Patent left  PICA origin, vertebrobasilar junction, dominant appearing right AICA. Patent basilar artery. Patent SCA and PCA origins. Posterior communicating arteries are diminutive or absent. Bilateral PCA branches are within normal limits. Antegrade flow in both ICA siphons. No siphon stenosis. Patent carotid termini. Normal MCA and ACA origins. Diminutive or absent anterior communicating artery. MCA M1 segments and both MCA bifurcations are patent without stenosis. On the right no MCA branch occlusion is identified. Visible left MCA and bilateral ACA branches are within normal limits. IMPRESSION: 1. Patchy acute Right MCA territory infarcts, including at the lateral right motor strip. No associated hemorrhage or mass effect. 2. No Right MCA branch occlusion identified, intracranial MRA is negative for age. Electronically Signed   By: Odessa Fleming M.D.   On: 09/10/2020 18:40   ECHOCARDIOGRAM COMPLETE  Result Date: 09/10/2020    ECHOCARDIOGRAM REPORT   Patient Name:   Jason Hunter Date of Exam: 09/10/2020 Medical Rec #:  [id number]     Height:       70.0 in Accession #:    [id number]    Weight:       180.0 lb Date of Birth:  [birth year]     BSA:          1.996 m Patient Age:    89 years      BP:           154/86 mmHg Patient Gender: M             HR:  107 bpm. Exam Location:  Inpatient Procedure: 2D Echo Indications:    TIA 435.9  History:        Patient has no prior history of Echocardiogram examinations.                 Arrythmias:Atrial Fibrillation; Risk Factors:Diabetes,                 Dyslipidemia and Hypertension.  Sonographer:    Celene Skeen RDCS (AE) Referring Phys: [id number] JENNIFER YATES  Sonographer Comments: patient was very agreeable. however, at times had difficulty following breathing instructions in regards to holding breath. IMPRESSIONS  1. Left ventricular ejection fraction, by estimation, is 60 to 65%. The left ventricle has normal function. The left ventricle has no regional wall motion  abnormalities. Left ventricular diastolic parameters are indeterminate.  2. Right ventricular systolic function is normal. The right ventricular size is normal. There is moderately elevated pulmonary artery systolic pressure. The estimated right ventricular systolic pressure is 55.4 mmHg.  3. Left atrial size was mildly dilated.  4. Right atrial size was mildly dilated.  5. The mitral valve is normal in structure. Trivial mitral valve regurgitation. No evidence of mitral stenosis.  6. The aortic valve is tricuspid. Aortic valve regurgitation is not visualized. Mild aortic valve sclerosis is present, with no evidence of aortic valve stenosis.  7. The inferior vena cava is dilated in size with <50% respiratory variability, suggesting right atrial pressure of 15 mmHg.  8. The patient was in atrial fibrillation. FINDINGS  Left Ventricle: Left ventricular ejection fraction, by estimation, is 60 to 65%. The left ventricle has normal function. The left ventricle has no regional wall motion abnormalities. The left ventricular internal cavity size was normal in size. There is  no left ventricular hypertrophy. Left ventricular diastolic parameters are indeterminate. Right Ventricle: The right ventricular size is normal. No increase in right ventricular wall thickness. Right ventricular systolic function is normal. There is moderately elevated pulmonary artery systolic pressure. The tricuspid regurgitant velocity is 3.18 m/s, and with an assumed right atrial pressure of 15 mmHg, the estimated right ventricular systolic pressure is 55.4 mmHg. Left Atrium: Left atrial size was mildly dilated. Right Atrium: Right atrial size was mildly dilated. Pericardium: There is no evidence of pericardial effusion. Mitral Valve: The mitral valve is normal in structure. Mild mitral annular calcification. Trivial mitral valve regurgitation. No evidence of mitral valve stenosis. Tricuspid Valve: The tricuspid valve is normal in structure.  Tricuspid valve regurgitation is trivial. Aortic Valve: The aortic valve is tricuspid. Aortic valve regurgitation is not visualized. Mild aortic valve sclerosis is present, with no evidence of aortic valve stenosis. Pulmonic Valve: The pulmonic valve was normal in structure. Pulmonic valve regurgitation is not visualized. Aorta: The aortic root is normal in size and structure. Venous: The inferior vena cava is dilated in size with less than 50% respiratory variability, suggesting right atrial pressure of 15 mmHg. IAS/Shunts: No atrial level shunt detected by color flow Doppler.  LEFT VENTRICLE PLAX 2D LVIDd:         4.90 cm LVIDs:         2.50 cm LV PW:         0.80 cm LV IVS:        0.90 cm LVOT diam:     2.30 cm LV SV:         64 LV SV Index:   32 LVOT Area:     4.15 cm  LEFT ATRIUM  Index LA diam:      5.10 cm 2.56 cm/m LA Vol (A2C): 47.7 ml 23.90 ml/m  AORTIC VALVE LVOT Vmax:   75.80 cm/s LVOT Vmean:  52.600 cm/s LVOT VTI:    0.155 m  AORTA Ao Root diam: 3.10 cm MITRAL VALVE                TRICUSPID VALVE MV Area (PHT): 6.96 cm     TR Peak grad:   40.4 mmHg MV Decel Time: 109 msec     TR Vmax:        318.00 cm/s MV E velocity: 104.00 cm/s                             SHUNTS                             Systemic VTI:  0.16 m                             Systemic Diam: 2.30 cm Marca Anconaalton Mclean MD Electronically signed by Marca Anconaalton Mclean MD Signature Date/Time: 09/10/2020/4:30:53 PM    Final    CT HEAD CODE STROKE WO CONTRAST  Result Date: 09/10/2020 CLINICAL DATA:  Code stroke.  Left facial droop and slurred speech. EXAM: CT HEAD WITHOUT CONTRAST TECHNIQUE: Contiguous axial images were obtained from the base of the skull through the vertex without intravenous contrast. COMPARISON:  None. FINDINGS: Brain: There is no evidence of an acute infarct, intracranial hemorrhage, mass, midline shift, or extra-axial fluid collection. The ventricles and sulci are within normal limits for age. Hypodensities in the  cerebral white matter bilaterally are nonspecific but compatible with mild chronic small vessel ischemic disease. Vascular: Calcified atherosclerosis at the skull base. No hyperdense vessel. Skull: No fracture or suspicious osseous lesion. Sinuses/Orbits: Minimal mucosal thickening in the left maxillary sinus. Clear mastoid air cells. Bilateral cataract extraction. Other: None. ASPECTS [facility]([place] Stroke Program Early CT Score) - Ganglionic level infarction (caudate, lentiform nuclei, internal capsule, insula, M1-M3 cortex): 7 - Supraganglionic infarction (M4-M6 cortex): 3 Total score (0-10 with 10 being normal): 10 IMPRESSION: 1. No evidence of acute intracranial abnormality. 2. ASPECTS is 10. 3. Mild chronic small vessel ischemic disease. These results were communicated to Dr. Pearlean BrownieSethi at 1:27 pm on 09/10/2020 by text page via the [facility] messaging system. Electronically Signed   By: Sebastian AcheAllen  Grady M.D.   On: 09/10/2020 13:27    PHYSICAL EXAM   Exam: NAD, pleasant                  Speech:    Speech is slightly dysarthric no aphasia; fluent and spontaneous with normal comprehension.  Cognition:    The patient is oriented to person, place, and time;     recent and remote memory intact;     language fluent;    Cranial Nerves:    The pupils are equal, round, and reactive to light.Trigeminal sensation is intact and the muscles of mastication are normal. Left lower facial droop. The palate elevates in the midline. Hearing intact. Voice is normal. Shoulder shrug is normal. The tongue has normal motion without fasciculations.   Coordination:  No dysmetria  Motor Observation:    No asymmetry, no atrophy, and no involuntary movements noted. Tone:    Normal muscle tone.     Strength:  Strength is V/V in the upper and lower limbs.      Sensation: intact to LT  Gait: Independently ambulates from the bathroom to chair.    ASSESSMENT/PLAN Jason Hunter is a 86 y.o. male with history of HTN, HLD,  DM, AF on [facility] presenting with L facial droop and L sided weakness.   Stroke:   Patchy R MCA territory infarct embolic secondary to AF on Eliquis but not compliant d/t recent small specks of blood in stool  Code Stroke CT head No acute abnormality. Small vessel disease. ASPECTS 10.     MRI  Patchy R MCA territory infarcts.  MRA  Unremarkable   Carotid Doppler  B ICA 1-39% stenosis, VAs antegrade   2D Echo EF 60-65%. LA mildly dilated. Pt in AF  LDL 75  HgbA1c 6.0  VTE prophylaxis - Eliquis  Eliquis (apixaban) daily prior to admission but had missed some doses, now on aspirin 325 mg daily and Eliquis (apixaban) daily. No indication for additional aspirin. Will stop. Continue eliquis alone without missing doses.    Therapy recommendations:  HH PT, RW  Disposition:  Return home Follow-up Stroke Clinic at [facility] in 4 weeks. Office will call with appointment date and time. Order placed.  Atrial Fibrillation  Home anticoagulation:  Eliquis (apixaban) daily - missed a few doses recently . Continue Eliquis (apixaban) daily at discharge   Hypertension  Stable . Permissive hypertension (OK if < 220/120) but gradually normalize in 5-7 days . Long-term BP goal normotensive  Hyperlipidemia  Home meds:  Not on a statin  Now on lipitor 40  LDL 75, goal < 70  Will decrease lipitor to 10 as a moderate intensive statin given LDL of 75  Continue statin at discharge  Diabetes type II Controlled  HgbA1c 6.0, goal < 7.0  Other Stroke Risk Factors  Advanced Age >/= 58   Hospital day # 0  Stroke will sign off  Personally examined patient and images, and have participated in and made any corrections needed to history, physical, neuro exam,assessment and plan as stated above.  I have personally obtained the history, evaluated lab date, reviewed imaging studies and agree with radiology interpretations.    Naomie Dean, MD Stroke Neurology  I spent 25  minutes of face-to-face and non-face-to-face time with patient. This included prechart review, lab review, study review, order entry, electronic health record documentation, patient education on the different diagnostic and therapeutic options, counseling and coordination of care, risks and benefits of management, compliance, or risk factor reduction   To contact Stroke Continuity provider, please refer to [email or web address]. After hours, contact General Neurology

## 2020-09-11 NOTE — TOC Initial Note (Signed)
Transition of Care [facility]) - Initial/Assessment Note    Patient Details  Name: Jason Hunter MRN: [id number] Date of Birth: [birth year]  [facility] CM/SW Contact:    Pollie Friar, RN Phone Number: 09/11/2020, 4:08 PM  Clinical Narrative:                 CM met with the patient and then called his son with his permission. Son states he lives with the patient and is able to provide needed supervision. Pt doesn't have recommended DME at home.  HH arranged through [facility].  DME to be delivered to the room per [facility].  Pt will have transport home when medically ready.   Expected Discharge Plan: [place] Barriers to Discharge: Continued Medical Work up   Patient Goals and CMS Choice   CMS Medicare.gov Compare Post Acute Care list provided to:: Patient Represenative (must comment) Choice offered to / list presented to : Adult Children  Expected Discharge Plan and Services Expected Discharge Plan: [place]   Discharge Planning Services: CM Consult Post Acute Care Choice: Durable Medical Equipment, Home Health Living arrangements for the past 2 months: Single Family Home                           HH Arranged: PT, OT, Speech Therapy          Prior Living Arrangements/Services Living arrangements for the past 2 months: Single Family Home Lives with:: Adult Children Patient language and need for interpreter reviewed:: Yes Do you feel safe going back to the place where you live?: Yes      Need for Family Participation in Patient Care: Yes (Comment) Care giver support system in place?: Yes (comment)   Criminal Activity/Legal Involvement Pertinent to Current Situation/Hospitalization: No - Comment as needed  Activities of Daily Living      Permission Sought/Granted                  Emotional Assessment Appearance:: Appears stated age Attitude/Demeanor/Rapport: Engaged Affect (typically observed):  Accepting Orientation: : Oriented to Self, Oriented to Place   Psych Involvement: No (comment)  Admission diagnosis:  TIA (transient ischemic attack) [G45.9] Cerebrovascular accident (CVA), unspecified mechanism ([place]) [I63.9] CVA (cerebral vascular accident) [facility]) [I63.9] Patient Active Problem List   Diagnosis Date Noted  . CVA (cerebral vascular accident) ([place]) 09/10/2020  . Atrial fibrillation ([place])   . Diabetes with neurologic complications ([place])   . Dyslipidemia   . Essential hypertension    PCP:  Lavone Orn, MD Pharmacy:   [facility] [place], [address] [place] [address] Phone: [phone] Fax: [phone]     Social Determinants of Health (SDOH) Interventions    Readmission Risk Interventions No flowsheet data found.

## 2020-09-11 NOTE — Progress Notes (Signed)
PROGRESS NOTE    Jason Hunter   ZOX:[id number]  DOB: 1928/04/29  DOA: 09/10/2020     0  PCP: Kirby FunkGriffin, John, MD  CC: left side weakness  Hospital Course: [name] is a 86 y.o. male with medical history significant of HTN; HLD, afib on Eliquis, and DM presented with L facial droop.   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   No dysphagia.  He fatigues easily but attributes that to age.  No headache.  He did have a bit of visual disturbance.  He has a large goiter, has had it for years, unchanged.    He underwent MRI brain which revealed patchy acute Right MCA territory infarcts, including at the lateral right motor strip. No associated hemorrhage or mass effect. MRA brain was normal. Carotid ultrasound was also unreavealing for significant stenosis.  Echo was also unremarkable, mildly elevated right side pressures but EF normal and no diastolic dysfunction.      Interval History:  Sitting up in chair bedside. Sitter is in room as patient keeps trying to leave; he also thinks his son is in the room despite being corrected a couple times.  His strength seems to have returned. We talked about him driving dump trucks and that this would likely not be possible going forward. He was slightly aggravated as he wishes to continue working and isn't ready to stop.  I commended him on being 86 yo and still working in his capacity.   Old records reviewed in assessment of this patient  ROS: Constitutional: negative for chills and fevers, Respiratory: negative for cough, Cardiovascular: negative for chest pain and Gastrointestinal: negative for abdominal pain  Assessment & Plan: * CVA (cerebral vascular accident) (HCC) - patient presented with left side weakness and facial droop; MRI brain shows right MCA infarcts. Vascular studies and echo are normal - some noncompliance with Eliquis at home due to occasional bloody penile  discharge; no thrombus seen on TTE - placed on statin - continue eliquis - per PT needs 24/7 assistance at home; will follow up with patient if his son is abe to provide this - I do not think at his age with this stroke he is safe to drive again much less operate a dump truck; he's wanting to return to work, however I've told him this is not possible and may not be ever again (for at least what he was doing); will continue to re-iterate this and discuss with his son as well  Atrial fibrillation (HCC) - patient on eliquis although states skips doses due to occasional bloody urine? Hgb stable - continue eliquis for now as patient tolerates   Essential hypertension - BP essentially at goal for his age currently - continue holding home meds for now - will resume slowly if needed  Dyslipidemia - continue statin - LDL 75  Diabetes with neurologic complications (HCC) - A1c 6% - continue SSI and CBGs    Antimicrobials: none  DVT prophylaxis: Eliquis Code Status: Full Family Communication: none present Disposition Plan: Status is: Inpatient  Remains inpatient appropriate because:Unsafe d/c plan and Inpatient level of care appropriate due to severity of illness   Dispo: The patient is from: Home              Anticipated d/c is to: Home              Anticipated d/c date is: 1 day  Patient currently is not medically stable to d/c.  Objective: Blood pressure (!) 151/86, pulse 83, temperature 98.4 F (36.9 C), temperature source Oral, resp. rate 20, height 5\' 11"  (1.803 m), weight 80.1 kg, SpO2 100 %.  Examination: General appearance: alert, cooperative and no distress Head: Normocephalic, without obvious abnormality, atraumatic Eyes: EOMI Lungs: clear to auscultation bilaterally Heart: irregularly irregular rhythm and S1, S2 normal Abdomen: normal findings: bowel sounds normal and soft, non-tender Extremities: no edema Skin: mobility and turgor normal Neurologic:  Grossly normal  Consultants:   neuro  Procedures:   none  Data Reviewed: I have personally reviewed following labs and imaging studies Results for orders placed or performed during the hospital encounter of 09/10/20 (from the past 24 hour(s))  Urine rapid drug screen (hosp performed)     Status: None   Collection Time: 09/10/20  5:24 PM  Result Value Ref Range   Opiates NONE DETECTED NONE DETECTED   Cocaine NONE DETECTED NONE DETECTED   Benzodiazepines NONE DETECTED NONE DETECTED   Amphetamines NONE DETECTED NONE DETECTED   Tetrahydrocannabinol NONE DETECTED NONE DETECTED   Barbiturates NONE DETECTED NONE DETECTED  TSH     Status: Abnormal   Collection Time: 09/10/20  9:08 PM  Result Value Ref Range   TSH 5.556 (H) 0.350 - 4.500 uIU/mL  Glucose, capillary     Status: Abnormal   Collection Time: 09/10/20 10:09 PM  Result Value Ref Range   Glucose-Capillary 168 (H) 70 - 99 mg/dL   Comment 1 Notify RN    Comment 2 Document in Chart   Hemoglobin A1c     Status: Abnormal   Collection Time: 09/11/20  3:19 AM  Result Value Ref Range   Hgb A1c MFr Bld 6.0 (H) 4.8 - 5.6 %   Mean Plasma Glucose 125.5 mg/dL  Lipid panel     Status: None   Collection Time: 09/11/20  3:19 AM  Result Value Ref Range   Cholesterol 137 0 - 200 mg/dL   Triglycerides 35 13/12/21 mg/dL   HDL 55 <017 mg/dL   Total CHOL/HDL Ratio 2.5 RATIO   VLDL 7 0 - 40 mg/dL   LDL Cholesterol 75 0 - 99 mg/dL  Glucose, capillary     Status: Abnormal   Collection Time: 09/11/20  6:17 AM  Result Value Ref Range   Glucose-Capillary 161 (H) 70 - 99 mg/dL   Comment 1 Notify RN    Comment 2 Document in Chart   T4, free     Status: None   Collection Time: 09/11/20  9:04 AM  Result Value Ref Range   Free T4 0.87 0.61 - 1.12 ng/dL    Recent Results (from the past 240 hour(s))  Respiratory Panel by RT PCR (Flu A&B, Covid) - Nasopharyngeal Swab     Status: None   Collection Time: 09/10/20  1:07 PM   Specimen: Nasopharyngeal  Swab  Result Value Ref Range Status   SARS Coronavirus 2 by RT PCR NEGATIVE NEGATIVE Final    Comment: (NOTE) SARS-CoV-2 target nucleic acids are NOT DETECTED.  The SARS-CoV-2 RNA is generally detectable in upper respiratoy specimens during the acute phase of infection. The lowest concentration of SARS-CoV-2 viral copies this assay can detect is 131 copies/mL. A negative result does not preclude SARS-Cov-2 infection and should not be used as the sole basis for treatment or other patient management decisions. A negative result may occur with  improper specimen collection/handling, submission of specimen other than nasopharyngeal swab, presence of  viral mutation(s) within the areas targeted by this assay, and inadequate number of viral copies (<131 copies/mL). A negative result must be combined with clinical observations, patient history, and epidemiological information. The expected result is Negative.  Fact Sheet for Patients:  https://www.moore.com/  Fact Sheet for Healthcare Providers:  https://www.young.biz/  This test is no t yet approved or cleared by the [place] FDA and  has been authorized for detection and/or diagnosis of SARS-CoV-2 by FDA under an Emergency Use Authorization (EUA). This EUA will remain  in effect (meaning this test can be used) for the duration of the COVID-19 declaration under Section 564(b)(1) of the Act, 21 U.S.C. section 360bbb-3(b)(1), unless the authorization is terminated or revoked sooner.     Influenza A by PCR NEGATIVE NEGATIVE Final   Influenza B by PCR NEGATIVE NEGATIVE Final    Comment: (NOTE) The Xpert Xpress SARS-CoV-2/FLU/RSV assay is intended as an aid in  the diagnosis of influenza from Nasopharyngeal swab specimens and  should not be used as a sole basis for treatment. Nasal washings and  aspirates are unacceptable for Xpert Xpress SARS-CoV-2/FLU/RSV  testing.  Fact Sheet for  Patients: https://www.moore.com/  Fact Sheet for Healthcare Providers: https://www.young.biz/  This test is not yet approved or cleared by the [place] FDA and  has been authorized for detection and/or diagnosis of SARS-CoV-2 by  FDA under an Emergency Use Authorization (EUA). This EUA will remain  in effect (meaning this test can be used) for the duration of the  Covid-19 declaration under Section 564(b)(1) of the Act, 21  U.S.C. section 360bbb-3(b)(1), unless the authorization is  terminated or revoked. Performed at [facility], [address], [place], [place] [address]      Radiology Studies: MR ANGIO HEAD WO CONTRAST  Result Date: 09/10/2020 CLINICAL DATA:  86 year old male with atrial fibrillation on Eliquis. Code stroke presentation earlier today. Left facial droop and slurred speech. EXAM: MRI HEAD WITHOUT CONTRAST MRA HEAD WITHOUT CONTRAST TECHNIQUE: Multiplanar, multiecho pulse sequences of the brain and surrounding structures were obtained without intravenous contrast. Angiographic images of the head were obtained using MRA technique without contrast. COMPARISON:  Plain head CT 1312 hours today. FINDINGS: MRI HEAD FINDINGS Brain: Multifocal patchy right MCA territory infarct affecting the lateral right motor strip and anterior right operculum. See series 4, image 34, series 6, image 18). No contralateral or other restricted diffusion. Minimal T2 and FLAIR hyperintensity at the affected parenchyma. No hemorrhage or mass effect. No chronic cortical encephalomalacia identified. Mild for age patchy bilateral white matter T2 and FLAIR hyperintensity. No midline shift, mass effect, evidence of mass lesion, ventriculomegaly, extra-axial collection or acute intracranial hemorrhage. Cervicomedullary junction and pituitary are within normal limits. Vascular: Major intracranial vascular flow voids are preserved. Skull and upper cervical spine:  Negative for age visible cervical spine. Visualized bone marrow signal is within normal limits. Sinuses/Orbits: Postoperative changes to both globes. Otherwise negative orbits. Paranasal sinuses and mastoids are stable and well pneumatized. Other: Grossly normal visible internal auditory structures. Negative visible scalp and face. MRA HEAD FINDINGS Antegrade flow in the posterior circulation with fairly codominant distal vertebral arteries. No distal vertebral stenosis. Patent left PICA origin, vertebrobasilar junction, dominant appearing right AICA. Patent basilar artery. Patent SCA and PCA origins. Posterior communicating arteries are diminutive or absent. Bilateral PCA branches are within normal limits. Antegrade flow in both ICA siphons. No siphon stenosis. Patent carotid termini. Normal MCA and ACA origins. Diminutive or absent anterior communicating artery. MCA M1  segments and both MCA bifurcations are patent without stenosis. On the right no MCA branch occlusion is identified. Visible left MCA and bilateral ACA branches are within normal limits. IMPRESSION: 1. Patchy acute Right MCA territory infarcts, including at the lateral right motor strip. No associated hemorrhage or mass effect. 2. No Right MCA branch occlusion identified, intracranial MRA is negative for age. Electronically Signed   By: Odessa Fleming M.D.   On: 09/10/2020 18:40   MR BRAIN WO CONTRAST  Result Date: 09/10/2020 CLINICAL DATA:  86 year old male with atrial fibrillation on Eliquis. Code stroke presentation earlier today. Left facial droop and slurred speech. EXAM: MRI HEAD WITHOUT CONTRAST MRA HEAD WITHOUT CONTRAST TECHNIQUE: Multiplanar, multiecho pulse sequences of the brain and surrounding structures were obtained without intravenous contrast. Angiographic images of the head were obtained using MRA technique without contrast. COMPARISON:  Plain head CT 1312 hours today. FINDINGS: MRI HEAD FINDINGS Brain: Multifocal patchy right MCA  territory infarct affecting the lateral right motor strip and anterior right operculum. See series 4, image 34, series 6, image 18). No contralateral or other restricted diffusion. Minimal T2 and FLAIR hyperintensity at the affected parenchyma. No hemorrhage or mass effect. No chronic cortical encephalomalacia identified. Mild for age patchy bilateral white matter T2 and FLAIR hyperintensity. No midline shift, mass effect, evidence of mass lesion, ventriculomegaly, extra-axial collection or acute intracranial hemorrhage. Cervicomedullary junction and pituitary are within normal limits. Vascular: Major intracranial vascular flow voids are preserved. Skull and upper cervical spine: Negative for age visible cervical spine. Visualized bone marrow signal is within normal limits. Sinuses/Orbits: Postoperative changes to both globes. Otherwise negative orbits. Paranasal sinuses and mastoids are stable and well pneumatized. Other: Grossly normal visible internal auditory structures. Negative visible scalp and face. MRA HEAD FINDINGS Antegrade flow in the posterior circulation with fairly codominant distal vertebral arteries. No distal vertebral stenosis. Patent left PICA origin, vertebrobasilar junction, dominant appearing right AICA. Patent basilar artery. Patent SCA and PCA origins. Posterior communicating arteries are diminutive or absent. Bilateral PCA branches are within normal limits. Antegrade flow in both ICA siphons. No siphon stenosis. Patent carotid termini. Normal MCA and ACA origins. Diminutive or absent anterior communicating artery. MCA M1 segments and both MCA bifurcations are patent without stenosis. On the right no MCA branch occlusion is identified. Visible left MCA and bilateral ACA branches are within normal limits. IMPRESSION: 1. Patchy acute Right MCA territory infarcts, including at the lateral right motor strip. No associated hemorrhage or mass effect. 2. No Right MCA branch occlusion identified,  intracranial MRA is negative for age. Electronically Signed   By: Odessa Fleming M.D.   On: 09/10/2020 18:40   ECHOCARDIOGRAM COMPLETE  Result Date: 09/10/2020    ECHOCARDIOGRAM REPORT   Patient Name:   Jason Hunter Date of Exam: 09/10/2020 Medical Rec #:  [id number]     Height:       70.0 in Accession #:    [id number]    Weight:       180.0 lb Date of Birth:  04-Jun-1928     BSA:          1.996 m Patient Age:    86 years      BP:           154/86 mmHg Patient Gender: M             HR:           107 bpm. Exam Location:  Inpatient Procedure: 2D Echo Indications:  TIA 435.9  History:        Patient has no prior history of Echocardiogram examinations.                 Arrythmias:Atrial Fibrillation; Risk Factors:Diabetes,                 Dyslipidemia and Hypertension.  Sonographer:    Celene Skeen RDCS (AE) Referring Phys: [id number] JENNIFER YATES  Sonographer Comments: patient was very agreeable. however, at times had difficulty following breathing instructions in regards to holding breath. IMPRESSIONS  1. Left ventricular ejection fraction, by estimation, is 60 to 65%. The left ventricle has normal function. The left ventricle has no regional wall motion abnormalities. Left ventricular diastolic parameters are indeterminate.  2. Right ventricular systolic function is normal. The right ventricular size is normal. There is moderately elevated pulmonary artery systolic pressure. The estimated right ventricular systolic pressure is 55.4 mmHg.  3. Left atrial size was mildly dilated.  4. Right atrial size was mildly dilated.  5. The mitral valve is normal in structure. Trivial mitral valve regurgitation. No evidence of mitral stenosis.  6. The aortic valve is tricuspid. Aortic valve regurgitation is not visualized. Mild aortic valve sclerosis is present, with no evidence of aortic valve stenosis.  7. The inferior vena cava is dilated in size with <50% respiratory variability, suggesting right atrial pressure of 15 mmHg.  8. The  patient was in atrial fibrillation. FINDINGS  Left Ventricle: Left ventricular ejection fraction, by estimation, is 60 to 65%. The left ventricle has normal function. The left ventricle has no regional wall motion abnormalities. The left ventricular internal cavity size was normal in size. There is  no left ventricular hypertrophy. Left ventricular diastolic parameters are indeterminate. Right Ventricle: The right ventricular size is normal. No increase in right ventricular wall thickness. Right ventricular systolic function is normal. There is moderately elevated pulmonary artery systolic pressure. The tricuspid regurgitant velocity is 3.18 m/s, and with an assumed right atrial pressure of 15 mmHg, the estimated right ventricular systolic pressure is 55.4 mmHg. Left Atrium: Left atrial size was mildly dilated. Right Atrium: Right atrial size was mildly dilated. Pericardium: There is no evidence of pericardial effusion. Mitral Valve: The mitral valve is normal in structure. Mild mitral annular calcification. Trivial mitral valve regurgitation. No evidence of mitral valve stenosis. Tricuspid Valve: The tricuspid valve is normal in structure. Tricuspid valve regurgitation is trivial. Aortic Valve: The aortic valve is tricuspid. Aortic valve regurgitation is not visualized. Mild aortic valve sclerosis is present, with no evidence of aortic valve stenosis. Pulmonic Valve: The pulmonic valve was normal in structure. Pulmonic valve regurgitation is not visualized. Aorta: The aortic root is normal in size and structure. Venous: The inferior vena cava is dilated in size with less than 50% respiratory variability, suggesting right atrial pressure of 15 mmHg. IAS/Shunts: No atrial level shunt detected by color flow Doppler.  LEFT VENTRICLE PLAX 2D LVIDd:         4.90 cm LVIDs:         2.50 cm LV PW:         0.80 cm LV IVS:        0.90 cm LVOT diam:     2.30 cm LV SV:         64 LV SV Index:   32 LVOT Area:     4.15 cm  LEFT  ATRIUM           Index LA diam:  5.10 cm 2.56 cm/m LA Vol (A2C): 47.7 ml 23.90 ml/m  AORTIC VALVE LVOT Vmax:   75.80 cm/s LVOT Vmean:  52.600 cm/s LVOT VTI:    0.155 m  AORTA Ao Root diam: 3.10 cm MITRAL VALVE                TRICUSPID VALVE MV Area (PHT): 6.96 cm     TR Peak grad:   40.4 mmHg MV Decel Time: 109 msec     TR Vmax:        318.00 cm/s MV E velocity: 104.00 cm/s                             SHUNTS                             Systemic VTI:  0.16 m                             Systemic Diam: 2.30 cm Marca Ancona MD Electronically signed by Marca Ancona MD Signature Date/Time: 09/10/2020/4:30:53 PM    Final    CT HEAD CODE STROKE WO CONTRAST  Result Date: 09/10/2020 CLINICAL DATA:  Code stroke.  Left facial droop and slurred speech. EXAM: CT HEAD WITHOUT CONTRAST TECHNIQUE: Contiguous axial images were obtained from the base of the skull through the vertex without intravenous contrast. COMPARISON:  None. FINDINGS: Brain: There is no evidence of an acute infarct, intracranial hemorrhage, mass, midline shift, or extra-axial fluid collection. The ventricles and sulci are within normal limits for age. Hypodensities in the cerebral white matter bilaterally are nonspecific but compatible with mild chronic small vessel ischemic disease. Vascular: Calcified atherosclerosis at the skull base. No hyperdense vessel. Skull: No fracture or suspicious osseous lesion. Sinuses/Orbits: Minimal mucosal thickening in the left maxillary sinus. Clear mastoid air cells. Bilateral cataract extraction. Other: None. ASPECTS [facility] Stroke Program Early CT Score) - Ganglionic level infarction (caudate, lentiform nuclei, internal capsule, insula, M1-M3 cortex): 7 - Supraganglionic infarction (M4-M6 cortex): 3 Total score (0-10 with 10 being normal): 10 IMPRESSION: 1. No evidence of acute intracranial abnormality. 2. ASPECTS is 10. 3. Mild chronic small vessel ischemic disease. These results were communicated to Dr. Pearlean Brownie  at 1:27 pm on 09/10/2020 by text page via the [facility] messaging system. Electronically Signed   By: Sebastian Ache M.D.   On: 09/10/2020 13:27   VAS US CAROTID (at [facility] and [facility] only)  Result Date: 09/11/2020 Carotid Arterial Duplex Study Indications:       TIA. Risk Factors:      Hypertension, Diabetes. Comparison Study:  no prior Performing Technologist: Blanch Media RVS  Examination Guidelines: A complete evaluation includes B-mode imaging, spectral Doppler, color Doppler, and power Doppler as needed of all accessible portions of each vessel. Bilateral testing is considered an integral part of a complete examination. Limited examinations for reoccurring indications may be performed as noted.  Right Carotid Findings: +----------+--------+--------+--------+------------------+--------+           PSV cm/sEDV cm/sStenosisPlaque DescriptionComments +----------+--------+--------+--------+------------------+--------+ CCA Prox  74                      heterogenous               +----------+--------+--------+--------+------------------+--------+ CCA Distal79      13  heterogenous               +----------+--------+--------+--------+------------------+--------+ ICA Prox  42      9       1-39%   heterogenous               +----------+--------+--------+--------+------------------+--------+ ICA Distal82      18                                         +----------+--------+--------+--------+------------------+--------+ ECA       76                                                 +----------+--------+--------+--------+------------------+--------+ +----------+--------+-------+--------+-------------------+           PSV cm/sEDV cmsDescribeArm Pressure (mmHG) +----------+--------+-------+--------+-------------------+ [id number]                                         +----------+--------+-------+--------+-------------------+ +---------+--------+--+--------+-+  VertebralPSV cm/s37EDV cm/s9 +---------+--------+--+--------+-+  Left Carotid Findings: +----------+--------+--------+--------+------------------+--------+           PSV cm/sEDV cm/sStenosisPlaque DescriptionComments +----------+--------+--------+--------+------------------+--------+ CCA Prox  83      11              heterogenous               +----------+--------+--------+--------+------------------+--------+ CCA Distal62      9               heterogenous               +----------+--------+--------+--------+------------------+--------+ ICA Prox  27      9       1-39%   heterogenous               +----------+--------+--------+--------+------------------+--------+ ICA Distal69      12                                         +----------+--------+--------+--------+------------------+--------+ ECA       63                                                 +----------+--------+--------+--------+------------------+--------+ +----------+--------+--------+--------+-------------------+           PSV cm/sEDV cm/sDescribeArm Pressure (mmHG) +----------+--------+--------+--------+-------------------+ [id number]                                          +----------+--------+--------+--------+-------------------+ +---------+--------+--+--------+-+---------+ VertebralPSV cm/s29EDV cm/s9Antegrade +---------+--------+--+--------+-+---------+   Summary: Right Carotid: Velocities in the right ICA are consistent with a 1-39% stenosis. Left Carotid: Velocities in the left ICA are consistent with a 1-39% stenosis. Vertebrals: Bilateral vertebral arteries demonstrate antegrade flow. *See table(s) above for measurements and observations.     Preliminary    VAS US CAROTID (at [facility] and [facility] only)      MR BRAIN WO CONTRAST  Final Result    MR  ANGIO HEAD WO CONTRAST  Final Result    CT HEAD CODE STROKE WO CONTRAST  Final Result      Scheduled Meds: . apixaban  5 mg  Oral BID  . aspirin  300 mg Rectal Daily   Or  . aspirin  325 mg Oral Daily  . atorvastatin  40 mg Oral Daily  . insulin aspart  0-15 Units Subcutaneous TID WC   PRN Meds: acetaminophen **OR** acetaminophen (TYLENOL) oral liquid 160 mg/5 mL **OR** acetaminophen, senna-docusate Continuous Infusions: . sodium chloride 50 mL/hr at 09/11/20 0449     LOS: 0 days  Time spent: Greater than 50% of the 35 minute visit was spent in counseling/coordination of care for the patient as laid out in the A&P.   Lewie Chamber, MD [facility] 09/11/2020, 1:51 PM

## 2020-09-11 NOTE — Progress Notes (Signed)
Floor coverage  Around 7:20 PM nursing staff attempted to do an in & out with return of no urine but copious amounts of blood clots.  Patient was not able to urinate.  Bladder scan revealed 562 cc urine.  Eliquis was held and I had placed an order for Foley and continuous bladder irrigation at that time.  I was just now notified by patient's RN that they were not able to place the Foley due to blood clots.  Urology has been consulted and will see the patient tonight, appreciate help.

## 2020-09-11 NOTE — Assessment & Plan Note (Signed)
-   patient on eliquis although states skips doses due to occasional bloody urine? Hgb stable - continue eliquis for now as patient tolerates

## 2020-09-11 NOTE — Evaluation (Signed)
Physical Therapy Evaluation Patient Details Name: Jason Hunter MRN: [id number] DOB: 02-07-1928 Today's Date: 09/11/2020   History of Present Illness  Pt is a 86 year old male who presented with L facial droop, impaired speech, visual changes, and L side weakness. Pt did not receive tPA. NIHSS of 3. MRI and MRA of head revealed acute R MCA infarcts. PMH significant for: HTN, a-fib, and DM.  Clinical Impression  Pt demonstrated WNL B LE strength, coordination, dynamic proprioception, and sensation. However, he displays significant balance deficits that place him at risk for falls. This is supported by his DGI score of 12 this date. Due to his unsteadiness and intermittent bouts of loss of balance, he required minA for gait and stair negotiation. He did demonstrate improved balance with use of a RW though, and would benefit from further AD/AE training. Pt is inconsistent with reports of presence of son and whether he owns a RW or not, so some cognitive deficits may be present. Will continue to follow acutely. Secondary to his son being available to assist him 24/7 and the pt requiring minA for mobility, will recommend HH PT upon d/c.     Follow Up Recommendations Home health PT;Supervision for mobility/OOB    Equipment Recommendations  Rolling walker with 5" wheels (unsure if pt owns RW or not)    Recommendations for Other Services       Precautions / Restrictions Precautions Precautions: Fall Restrictions Weight Bearing Restrictions: No      Mobility  Bed Mobility               General bed mobility comments: Pt sitting up in recliner upon arrival, thus did not get to formally assess bed mob.    Transfers Overall transfer level: Needs assistance Equipment used: None Transfers: Sit to/from Stand Sit to Stand: Min guard         General transfer comment: Pt required cues for hand placement with STS 2x without RW then 1x with RW. Unsteadiness noted but no LOB, min guard for  safety.  Ambulation/Gait Ambulation/Gait assistance: Min assist Gait Distance (Feet): 250 Feet Assistive device: None Gait Pattern/deviations: Step-through pattern;Narrow base of support;Staggering left;Trunk flexed (B feet internally rotated) Gait velocity: decreased Gait velocity interpretation: <1.8 ft/sec, indicate of risk for recurrent falls General Gait Details: Pt ambulates with B LEs internally rotated with narrow BOS. Pt displays unsteadiness and intermittent staggering/LOB to the L, resulting in minA to recover. Pt demonstrates decreased balance when provided challenges during gait. Pt ambulates with decreased unsteadiness when utilizing RW within room.  Stairs Stairs: Yes Stairs assistance: Min assist Stair Management: One rail Right;Alternating pattern Number of Stairs: 10 General stair comments: Ascending with reciprocal gait pattern and descending with reciprocal and intermittent step-to gait pattern, requiring extra time to descend with decreased balance noted. Cued pt to lead descending with weaker leg if applicable.  Wheelchair Mobility    Modified Rankin (Stroke Patients Only) Modified Rankin (Stroke Patients Only) Pre-Morbid Rankin Score: No symptoms Modified Rankin: Moderately severe disability     Balance Overall balance assessment: Needs assistance Sitting-balance support: No upper extremity supported;Feet supported Sitting balance-Leahy Scale: Good Sitting balance - Comments: No LOB and no support needed.   Standing balance support: No upper extremity supported;During functional activity Standing balance-Leahy Scale: Good Standing balance comment: Ambulates without RW with increased unsteadiness noted and intermittent bouts of LOB. Improved stability with use of RW.  Standardized Balance Assessment Standardized Balance Assessment : Dynamic Gait Index   Dynamic Gait Index Level Surface: Moderate Impairment Change in Gait Speed:  Mild Impairment Gait with Horizontal Head Turns: Moderate Impairment Gait with Vertical Head Turns: Moderate Impairment Gait and Pivot Turn: Mild Impairment Step Over Obstacle: Mild Impairment Step Around Obstacles: Moderate Impairment Steps: Mild Impairment Total Score: 12       Pertinent Vitals/Pain Pain Assessment: No/denies pain    Home Living Family/patient expects to be discharged to:: Private residence Living Arrangements: Children (son) Available Help at Discharge: Available 24 hours/day Type of Home: House (split-level) Home Access: Stairs to enter Entrance Stairs-Rails: None Entrance Stairs-Number of Steps: 4 Home Layout: One level (basement, but no necessary access) Home Equipment: Grab bars - tub/shower;Wheelchair - power;Walker - 2 wheels Additional Comments: Unsure of whether pt has RW as he would report having one and then deny having one.    Prior Function Level of Independence: Independent         Comments: Pt was still driving. Pt works part-time as a Naval architect.     Hand Dominance   Dominant Hand: Right    Extremity/Trunk Assessment   Upper Extremity Assessment Upper Extremity Assessment: Defer to OT evaluation    Lower Extremity Assessment Lower Extremity Assessment: Overall WFL for tasks assessed    Cervical / Trunk Assessment Cervical / Trunk Assessment: Normal  Communication   Communication: Expressive difficulties  Cognition Arousal/Alertness: Awake/alert Behavior During Therapy: WFL for tasks assessed/performed Overall Cognitive Status: Impaired/Different from baseline Area of Impairment: Attention;Memory;Following commands;Safety/judgement;Awareness;Problem solving                   Current Attention Level: Sustained Memory: Decreased short-term memory Following Commands: Follows one step commands consistently;Follows multi-step commands inconsistently Safety/Judgement: Decreased awareness of safety;Decreased awareness  of deficits Awareness: Emergent Problem Solving: Difficulty sequencing;Requires verbal cues General Comments: A&Ox4. However, pt reported son was outside room but sitter reported this was not true. Pt did not acknowledge his deficits in balance originally. Required cues to sequence how to manage RW. Pt reported not having a RW one moment then that he had one another moment.      General Comments General comments (skin integrity, edema, etc.): No pitting edema noted in LEs, no wounds noted in LEs    Exercises     Assessment/Plan    PT Assessment Patient needs continued PT services  PT Problem List Decreased balance;Decreased mobility;Decreased cognition;Decreased knowledge of use of DME;Decreased safety awareness       PT Treatment Interventions DME instruction;Gait training;Stair training;Functional mobility training;Therapeutic activities;Therapeutic exercise;Balance training;Neuromuscular re-education;Cognitive remediation;Patient/family education    PT Goals (Current goals can be found in the Care Plan section)  Acute Rehab PT Goals Patient Stated Goal: to go home PT Goal Formulation: With patient Time For Goal Achievement: 09/25/20 Potential to Achieve Goals: Good    Frequency Min 3X/week   Barriers to discharge        Co-evaluation               AM-PAC PT "6 Clicks" Mobility  Outcome Measure Help needed turning from your back to your side while in a flat bed without using bedrails?: A Little Help needed moving from lying on your back to sitting on the side of a flat bed without using bedrails?: A Little Help needed moving to and from a bed to a chair (including a wheelchair)?: A Little Help needed standing up from a chair using your arms (e.g., wheelchair  or bedside chair)?: A Little Help needed to walk in hospital room?: A Little Help needed climbing 3-5 steps with a railing? : A Little 6 Click Score: 18    End of Session Equipment Utilized During Treatment:  Gait belt Activity Tolerance: Patient tolerated treatment well Patient left: in chair;with call bell/phone within reach;with nursing/sitter in room Nurse Communication: Mobility status PT Visit Diagnosis: Unsteadiness on feet (R26.81);Other abnormalities of gait and mobility (R26.89);Other symptoms and signs involving the nervous system (R29.898)    Time: 0820-0850 PT Time Calculation (min) (ACUTE ONLY): 30 min   Charges:   PT Evaluation $PT Eval Low Complexity: 1 Low PT Treatments $Gait Training: 8-22 mins        Raymond Gurney, PT, DPT Acute Rehabilitation Services  Pager: [phone] Office: [phone]   Jewel Baize 09/11/2020, 9:07 AM

## 2020-09-11 NOTE — Progress Notes (Signed)
OT EVALUATIOn  PT admitted with CVA. Pt currently with functional limitiations due to the deficits listed below (see OT problem list). Pt demonstrates balance deficit and lack of awareness to fall risk. Recommendation for RW use and (A) with adls initially with d/c home.  Pt will benefit from skilled OT to increase their independence and safety with adls and balance to allow discharge HHOT.   09/11/20 1000  OT Visit Information  Last OT Received On 09/11/20  Assistance Needed +1  History of Present Illness Pt is a 86 year old male who presented with L facial droop, impaired speech, visual changes, and L side weakness. Pt did not receive tPA. NIHSS of 3. MRI and MRA of head revealed acute R MCA infarcts. PMH significant for: HTN, a-fib, and DM.  Precautions  Precautions Fall  Home Living  Family/patient expects to be discharged to: Private residence  Living Arrangements Children  Available Help at Discharge Available 24 hours/day  Type of Home House  Home Access Stairs to enter  Entrance Stairs-Number of Steps 4  Entrance Stairs-Rails None  Home Layout One level  Bathroom Shower/Tub Tub/shower unit;Door  Horticulturist, commercial Yes  Home Equipment Grab bars - tub/shower;Wheelchair - power;Walker - 2 wheels  Additional Comments reports that he drives for this part time job and little bit of money  Prior Function  Level of Independence Independent  Comments Pt was still driving. Pt works part-time as a Naval architect.  Communication  Communication Expressive difficulties  Pain Assessment  Pain Assessment No/denies pain  Cognition  Arousal/Alertness Awake/alert  Behavior During Therapy WFL for tasks assessed/performed  Overall Cognitive Status Impaired/Different from baseline  Current Attention Level Selective  Following Commands Follows one step commands consistently;Follows multi-step commands with increased time  Safety/Judgement Decreased awareness of  deficits  Awareness Emergent  General Comments pt able to recall information present by doctor as OT arriving with min cues. pt reports i really hate to give up my driving but she says i shouldnt be driving.   Upper Extremity Assessment  Upper Extremity Assessment LUE deficits/detail  LUE Deficits / Details noted to have mild tremor, self reports that is not new. able to complete grooming task at skin with opening and closing tooth paste  Cervical / Trunk Assessment  Cervical / Trunk Assessment Normal  ADL  Overall ADL's  Needs assistance/impaired  Grooming Wash/dry hands;Wash/dry face;Oral care;Min guard;Standing  Lower Body Dressing Min guard;Sit to/from Journalist, newspaper Details (indicate cue type and reason) abandons rw during transfer  Functional mobility during ADLs Min guard;Rolling walker  General ADL Comments pt needs cues throughout session for safety due to releasing RW.   Bed Mobility  Overal bed mobility Needs Assistance  Bed Mobility Sit to Supine  Sit to supine Modified independent (Device/Increase time)  General bed mobility comments oob in chair on arrival and returned to bed at end of session due to pending test with staff ready to transport  Transfers  Overall transfer level Needs assistance  Transfers Sit to/from Stand  Sit to Stand Min guard  OT - End of Session  Equipment Utilized During Treatment Gait belt;Rolling walker  Activity Tolerance Patient tolerated treatment well  Patient left in bed;Other (comment) (staff pending transport)  Nurse Communication Mobility status;Precautions  OT Assessment  OT Recommendation/Assessment Patient needs continued OT Services  OT Visit Diagnosis Unsteadiness on feet (R26.81);Muscle weakness (generalized) (M62.81)  OT Problem List Impaired balance (sitting and/or standing);Decreased  activity tolerance;Decreased safety awareness;Decreased knowledge of use of DME or AE  OT Plan   OT Frequency (ACUTE ONLY) Min 2X/week  OT Treatment/Interventions (ACUTE ONLY) Self-care/ADL training;Therapeutic exercise;Neuromuscular education;Energy conservation;DME and/or AE instruction;Therapeutic activities;Patient/family education;Balance training  AM-PAC OT "6 Clicks" Daily Activity Outcome Measure (Version 2)  Help from another person eating meals? 4  Help from another person taking care of personal grooming? 3  Help from another person toileting, which includes using toliet, bedpan, or urinal? 3  Help from another person bathing (including washing, rinsing, drying)? 3  Help from another person to put on and taking off regular upper body clothing? 3  Help from another person to put on and taking off regular lower body clothing? 3  6 Click Score 19  OT Recommendation  Follow Up Recommendations Home health OT  OT Equipment Other (comment) (RW)  Individuals Consulted  Consulted and Agree with Results and Recommendations Patient  Acute Rehab OT Goals  Patient Stated Goal to drive again  OT Goal Formulation With patient  Time For Goal Achievement 09/25/20  Potential to Achieve Goals Good  OT Time Calculation  OT Start Time (ACUTE ONLY) 1010  OT Stop Time (ACUTE ONLY) 1038  OT Time Calculation (min) 28 min  OT General Charges  $OT Visit 1 Visit  OT Evaluation  $OT Eval Moderate Complexity 1 Mod  OT Treatments  $Self Care/Home Management  8-22 mins  Written Expression  Dominant Hand Right   Brynn, OTR/L  Acute Rehabilitation Services Pager: [phone] Office: [phone] .

## 2020-09-11 NOTE — Assessment & Plan Note (Addendum)
-   patient presented with left side weakness and facial droop; MRI brain shows right MCA infarcts. Vascular studies and echo are normal - some noncompliance with Eliquis at home due to occasional bloody penile discharge; no thrombus seen on TTE - placed on statin - continue eliquis - per PT needs 24/7 assistance at home; patient states son would be able to take care of him at home -He understands recommendation to remain out of work -Outpatient follow-up with neurology

## 2020-09-11 NOTE — Progress Notes (Signed)
Patient refusing to take eliquis. Patient hs experiencing drips of blood periodically through his penis. Patient educated and MD notified.

## 2020-09-11 NOTE — Assessment & Plan Note (Addendum)
-   BP essentially at goal for his age currently - continue home meds at discharge -May be able to decrease home medications at follow-up

## 2020-09-12 DIAGNOSIS — R338 Other retention of urine: Secondary | ICD-10-CM | POA: Diagnosis not present

## 2020-09-12 DIAGNOSIS — R339 Retention of urine, unspecified: Secondary | ICD-10-CM

## 2020-09-12 DIAGNOSIS — I639 Cerebral infarction, unspecified: Secondary | ICD-10-CM | POA: Diagnosis not present

## 2020-09-12 LAB — GLUCOSE, CAPILLARY
Glucose-Capillary: 112 mg/dL — ABNORMAL HIGH (ref 70–99)
Glucose-Capillary: 184 mg/dL — ABNORMAL HIGH (ref 70–99)
Glucose-Capillary: 121 mg/dL — ABNORMAL HIGH (ref 70–99)
Glucose-Capillary: 110 mg/dL — ABNORMAL HIGH (ref 70–99)

## 2020-09-12 LAB — CBC WITH DIFFERENTIAL/PLATELET
Abs Immature Granulocytes: 0.11 10*3/uL — ABNORMAL HIGH (ref 0.00–0.07)
Basophils Absolute: 0 10*3/uL (ref 0.0–0.1)
Basophils Relative: 0 %
Eosinophils Absolute: 0 10*3/uL (ref 0.0–0.5)
Eosinophils Relative: 0 %
HCT: 35 % — ABNORMAL LOW (ref 39.0–52.0)
Hemoglobin: 11.4 g/dL — ABNORMAL LOW (ref 13.0–17.0)
Immature Granulocytes: 1 %
Lymphocytes Relative: 10 %
Lymphs Abs: 1.1 10*3/uL (ref 0.7–4.0)
MCH: 30.3 pg (ref 26.0–34.0)
MCHC: 32.6 g/dL (ref 30.0–36.0)
MCV: 93.1 fL (ref 80.0–100.0)
Monocytes Absolute: 1.5 10*3/uL — ABNORMAL HIGH (ref 0.1–1.0)
Monocytes Relative: 13 %
Neutro Abs: 8.9 10*3/uL — ABNORMAL HIGH (ref 1.7–7.7)
Neutrophils Relative %: 76 %
Platelets: 294 10*3/uL (ref 150–400)
RBC: 3.76 MIL/uL — ABNORMAL LOW (ref 4.22–5.81)
RDW: 13.7 % (ref 11.5–15.5)
WBC: 11.7 10*3/uL — ABNORMAL HIGH (ref 4.0–10.5)
nRBC: 0 % (ref 0.0–0.2)

## 2020-09-12 LAB — BASIC METABOLIC PANEL
Anion gap: 8 (ref 5–15)
BUN: 24 mg/dL — ABNORMAL HIGH (ref 8–23)
CO2: 24 mmol/L (ref 22–32)
Calcium: 7.8 mg/dL — ABNORMAL LOW (ref 8.9–10.3)
Chloride: 107 mmol/L (ref 98–111)
Creatinine, Ser: 2.06 mg/dL — ABNORMAL HIGH (ref 0.61–1.24)
GFR, Estimated: 30 mL/min — ABNORMAL LOW (ref >60)
Glucose, Bld: 186 mg/dL — ABNORMAL HIGH (ref 70–99)
Potassium: 3.4 mmol/L — ABNORMAL LOW (ref 3.5–5.1)
Sodium: 139 mmol/L (ref 135–145)

## 2020-09-12 LAB — BASIC METABOLIC PANEL WITH GFR
Anion gap: 9 (ref 5–15)
BUN: 28 mg/dL — ABNORMAL HIGH (ref 8–23)
CO2: 22 mmol/L (ref 22–32)
Calcium: 7.8 mg/dL — ABNORMAL LOW (ref 8.9–10.3)
Chloride: 108 mmol/L (ref 98–111)
Creatinine, Ser: 2.88 mg/dL — ABNORMAL HIGH (ref 0.61–1.24)
GFR, Estimated: 20 mL/min — ABNORMAL LOW
Glucose, Bld: 138 mg/dL — ABNORMAL HIGH (ref 70–99)
Potassium: 3.2 mmol/L — ABNORMAL LOW (ref 3.5–5.1)
Sodium: 139 mmol/L (ref 135–145)

## 2020-09-12 LAB — MAGNESIUM: Magnesium: 1.6 mg/dL — ABNORMAL LOW (ref 1.7–2.4)

## 2020-09-12 LAB — T3: T3, Total: 78 ng/dL (ref 71–180)

## 2020-09-12 MED ORDER — APIXABAN 2.5 MG PO TABS
2.5000 mg | ORAL_TABLET | Freq: Two times a day (BID) | ORAL | Status: DC
  Administered 2020-09-12 (×2): 2.5 mg via ORAL
  Filled 2020-09-12 (×2): qty 1

## 2020-09-12 MED ORDER — MORPHINE SULFATE (PF) 2 MG/ML IV SOLN: 1.0000 mg | Freq: Once | INTRAVENOUS | Status: DC | PRN

## 2020-09-12 MED ORDER — MAGNESIUM OXIDE 400 (241.3 MG) MG PO TABS
800.0000 mg | ORAL_TABLET | Freq: Once | ORAL | Status: AC
  Administered 2020-09-12: 800 mg via ORAL
  Filled 2020-09-12: qty 2

## 2020-09-12 MED ORDER — POTASSIUM CHLORIDE CRYS ER 20 MEQ PO TBCR
40.0000 meq | EXTENDED_RELEASE_TABLET | Freq: Once | ORAL | Status: AC
  Administered 2020-09-12: 40 meq via ORAL
  Filled 2020-09-12: qty 2

## 2020-09-12 MED ORDER — CHLORHEXIDINE GLUCONATE CLOTH 2 % EX PADS
6.0000 | MEDICATED_PAD | Freq: Every day | CUTANEOUS | Status: DC
  Administered 2020-09-12 – 2020-09-13 (×2): 6 via TOPICAL

## 2020-09-12 NOTE — Consult Note (Signed)
Urology Consult   Physician requesting consult: John Giovanni, MD  Reason for consult: clot retention  History of Present Illness: Jason Hunter is a 86 y.o. is seen in consultation for gross hematuria with clot retention.  He has a past medical history of hypertension, hyperlipidemia, afibrillation on Eliquis and diabetes who presented on 09/10/2020 with a left facial droop and found to have a right MCA infarct with no associated hemorrhage.  He was found to be retaining some urine in and out catheter was attempted around 7 PM on 09/11/2020.  According to the notes, the nurse received copious amounts of blood clots with no urine return.  A bladder scan was performed that demonstrated 562 mL of urine. Urology was not consulted until after midnight 09/12/2020 to address this issue.  Patient denies prior gross hematuria. He denies BPH or difficulty voiding. He states he has a fair flow stream. He denies sensation of incomplete bladder emptying prior to tonight. He denies frequency or urgency. He has 1 time nocturia. He states he has never seen a urologist prior.  Past Medical History:  Diagnosis Date  . Atrial fibrillation (HCC)   . Diabetes with neurologic complications (HCC)   . Dyslipidemia   . Essential hypertension     History reviewed. No pertinent surgical history.  Medications:  Home meds:  No current facility-administered medications on file prior to encounter.   Current Outpatient Medications on File Prior to Encounter  Medication Sig Dispense Refill  . amLODipine (NORVASC) 10 MG tablet Take 10 mg by mouth daily.    . carvedilol (COREG) 25 MG tablet Take 25 mg by mouth 2 (two) times daily.    Marland Kitchen ELIQUIS 5 MG TABS tablet Take 5 mg by mouth 2 (two) times daily.    . metFORMIN (GLUCOPHAGE-XR) 500 MG 24 hr tablet Take 500 mg by mouth 2 (two) times daily.    Marland Kitchen telmisartan (MICARDIS) 80 MG tablet Take 80 mg by mouth daily.       Scheduled Meds: . atorvastatin  10 mg Oral  Daily  . insulin aspart  0-15 Units Subcutaneous TID WC   Continuous Infusions: . sodium chloride 50 mL/hr at 09/11/20 0449  . sodium chloride irrigation     PRN Meds:.acetaminophen **OR** acetaminophen (TYLENOL) oral liquid 160 mg/5 mL **OR** acetaminophen, senna-docusate  Allergies: No Known Allergies  History reviewed. No pertinent family history.  Social History:  reports that he has never smoked. He has never used smokeless tobacco. He reports that he does not drink alcohol and does not use drugs.  ROS: A complete review of systems was performed.  All systems are negative except for pertinent findings as noted.  Physical Exam:  Vital signs in last 24 hours: Temp:  [98.1 F (36.7 C)-98.5 F (36.9 C)] 98.1 F (36.7 C) (11/12 2338) Pulse Rate:  [78-100] 87 (11/12 2338) Resp:  [18-20] 18 (11/12 2338) BP: (127-157)/(69-97) 135/82 (11/12 2338) SpO2:  [99 %-100 %] 100 % (11/12 2338) Constitutional:  Alert and oriented, No acute distress Cardiovascular: Regular rate and rhythm Respiratory: Normal respiratory effort, Lungs clear bilaterally GI: Abdomen is soft, nontender, nondistended, no abdominal masses Genitourinary: No CVAT. Normal male phallus, testes are descended bilaterally and non-tender and without masses, scrotum is normal in appearance without lesions or masses, perineum is normal on inspection. Rectal: Normal sphincter tone, no rectal masses, prostate is non tender and without nodularity. Prostate size is estimated to be 60 grams, no nodules Neurologic: Grossly intact, no focal deficits Psychiatric: Normal mood  and affect  Laboratory Data:  Recent Labs    09/10/20 1306 09/10/20 1315  WBC 6.2  --   HGB 11.9* 13.6  HCT 38.7* 40.0  PLT 306  --     Recent Labs    09/10/20 1306 09/10/20 1315  NA 138 139  K 3.7 3.7  CL 106 103  GLUCOSE 120* 117*  BUN 13 16  CALCIUM 8.8*  --   CREATININE 0.96 0.80     Results for orders placed or performed during the  hospital encounter of 09/10/20 (from the past 24 hour(s))  Hemoglobin A1c     Status: Abnormal   Collection Time: 09/11/20  3:19 AM  Result Value Ref Range   Hgb A1c MFr Bld 6.0 (H) 4.8 - 5.6 %   Mean Plasma Glucose 125.5 mg/dL  Lipid panel     Status: None   Collection Time: 09/11/20  3:19 AM  Result Value Ref Range   Cholesterol 137 0 - 200 mg/dL   Triglycerides 35 <956 mg/dL   HDL 55 >21 mg/dL   Total CHOL/HDL Ratio 2.5 RATIO   VLDL 7 0 - 40 mg/dL   LDL Cholesterol 75 0 - 99 mg/dL  Glucose, capillary     Status: Abnormal   Collection Time: 09/11/20  6:17 AM  Result Value Ref Range   Glucose-Capillary 161 (H) 70 - 99 mg/dL   Comment 1 Notify RN    Comment 2 Document in Chart   T4, free     Status: None   Collection Time: 09/11/20  9:04 AM  Result Value Ref Range   Free T4 0.87 0.61 - 1.12 ng/dL  Glucose, capillary     Status: Abnormal   Collection Time: 09/11/20  1:54 PM  Result Value Ref Range   Glucose-Capillary 149 (H) 70 - 99 mg/dL  Glucose, capillary     Status: Abnormal   Collection Time: 09/11/20  4:12 PM  Result Value Ref Range   Glucose-Capillary 109 (H) 70 - 99 mg/dL  Glucose, capillary     Status: Abnormal   Collection Time: 09/11/20  9:35 PM  Result Value Ref Range   Glucose-Capillary 151 (H) 70 - 99 mg/dL   Comment 1 Notify RN    Comment 2 Document in Chart    Recent Results (from the past 240 hour(s))  Respiratory Panel by RT PCR (Flu A&B, Covid) - Nasopharyngeal Swab     Status: None   Collection Time: 09/10/20  1:07 PM   Specimen: Nasopharyngeal Swab  Result Value Ref Range Status   SARS Coronavirus 2 by RT PCR NEGATIVE NEGATIVE Final    Comment: (NOTE) SARS-CoV-2 target nucleic acids are NOT DETECTED.  The SARS-CoV-2 RNA is generally detectable in upper respiratoy specimens during the acute phase of infection. The lowest concentration of SARS-CoV-2 viral copies this assay can detect is 131 copies/mL. A negative result does not preclude  SARS-Cov-2 infection and should not be used as the sole basis for treatment or other patient management decisions. A negative result may occur with  improper specimen collection/handling, submission of specimen other than nasopharyngeal swab, presence of viral mutation(s) within the areas targeted by this assay, and inadequate number of viral copies (<131 copies/mL). A negative result must be combined with clinical observations, patient history, and epidemiological information. The expected result is Negative.  Fact Sheet for Patients:  https://www.moore.com/  Fact Sheet for Healthcare Providers:  https://www.young.biz/  This test is no t yet approved or cleared by the [place]  FDA and  has been authorized for detection and/or diagnosis of SARS-CoV-2 by FDA under an Emergency Use Authorization (EUA). This EUA will remain  in effect (meaning this test can be used) for the duration of the COVID-19 declaration under Section 564(b)(1) of the Act, 21 U.S.C. section 360bbb-3(b)(1), unless the authorization is terminated or revoked sooner.     Influenza A by PCR NEGATIVE NEGATIVE Final   Influenza B by PCR NEGATIVE NEGATIVE Final    Comment: (NOTE) The Xpert Xpress SARS-CoV-2/FLU/RSV assay is intended as an aid in  the diagnosis of influenza from Nasopharyngeal swab specimens and  should not be used as a sole basis for treatment. Nasal washings and  aspirates are unacceptable for Xpert Xpress SARS-CoV-2/FLU/RSV  testing.  Fact Sheet for Patients: https://www.moore.com/  Fact Sheet for Healthcare Providers: https://www.young.biz/  This test is not yet approved or cleared by the [place] FDA and  has been authorized for detection and/or diagnosis of SARS-CoV-2 by  FDA under an Emergency Use Authorization (EUA). This EUA will remain  in effect (meaning this test can be used) for the duration of the   Covid-19 declaration under Section 564(b)(1) of the Act, 21  U.S.C. section 360bbb-3(b)(1), unless the authorization is  terminated or revoked. Performed at [facility], [address], [place], [place] [address]     Renal Function: Recent Labs    09/10/20 1306 09/10/20 1315  CREATININE 0.96 0.80   Estimated Creatinine Clearance: 65.4 mL/min (by C-G formula based on SCr of 0.8 mg/dL).  Radiologic Imaging: MR ANGIO HEAD WO CONTRAST  Result Date: 09/10/2020 CLINICAL DATA:  86 year old male with atrial fibrillation on Eliquis. Code stroke presentation earlier today. Left facial droop and slurred speech. EXAM: MRI HEAD WITHOUT CONTRAST MRA HEAD WITHOUT CONTRAST TECHNIQUE: Multiplanar, multiecho pulse sequences of the brain and surrounding structures were obtained without intravenous contrast. Angiographic images of the head were obtained using MRA technique without contrast. COMPARISON:  Plain head CT 1312 hours today. FINDINGS: MRI HEAD FINDINGS Brain: Multifocal patchy right MCA territory infarct affecting the lateral right motor strip and anterior right operculum. See series 4, image 34, series 6, image 18). No contralateral or other restricted diffusion. Minimal T2 and FLAIR hyperintensity at the affected parenchyma. No hemorrhage or mass effect. No chronic cortical encephalomalacia identified. Mild for age patchy bilateral white matter T2 and FLAIR hyperintensity. No midline shift, mass effect, evidence of mass lesion, ventriculomegaly, extra-axial collection or acute intracranial hemorrhage. Cervicomedullary junction and pituitary are within normal limits. Vascular: Major intracranial vascular flow voids are preserved. Skull and upper cervical spine: Negative for age visible cervical spine. Visualized bone marrow signal is within normal limits. Sinuses/Orbits: Postoperative changes to both globes. Otherwise negative orbits. Paranasal sinuses and mastoids are stable and well  pneumatized. Other: Grossly normal visible internal auditory structures. Negative visible scalp and face. MRA HEAD FINDINGS Antegrade flow in the posterior circulation with fairly codominant distal vertebral arteries. No distal vertebral stenosis. Patent left PICA origin, vertebrobasilar junction, dominant appearing right AICA. Patent basilar artery. Patent SCA and PCA origins. Posterior communicating arteries are diminutive or absent. Bilateral PCA branches are within normal limits. Antegrade flow in both ICA siphons. No siphon stenosis. Patent carotid termini. Normal MCA and ACA origins. Diminutive or absent anterior communicating artery. MCA M1 segments and both MCA bifurcations are patent without stenosis. On the right no MCA branch occlusion is identified. Visible left MCA and bilateral ACA branches are within normal limits. IMPRESSION: 1. Patchy acute Right  MCA territory infarcts, including at the lateral right motor strip. No associated hemorrhage or mass effect. 2. No Right MCA branch occlusion identified, intracranial MRA is negative for age. Electronically Signed   By: Odessa Fleming M.D.   On: 09/10/2020 18:40   MR BRAIN WO CONTRAST  Result Date: 09/10/2020 CLINICAL DATA:  86 year old male with atrial fibrillation on Eliquis. Code stroke presentation earlier today. Left facial droop and slurred speech. EXAM: MRI HEAD WITHOUT CONTRAST MRA HEAD WITHOUT CONTRAST TECHNIQUE: Multiplanar, multiecho pulse sequences of the brain and surrounding structures were obtained without intravenous contrast. Angiographic images of the head were obtained using MRA technique without contrast. COMPARISON:  Plain head CT 1312 hours today. FINDINGS: MRI HEAD FINDINGS Brain: Multifocal patchy right MCA territory infarct affecting the lateral right motor strip and anterior right operculum. See series 4, image 34, series 6, image 18). No contralateral or other restricted diffusion. Minimal T2 and FLAIR hyperintensity at the affected  parenchyma. No hemorrhage or mass effect. No chronic cortical encephalomalacia identified. Mild for age patchy bilateral white matter T2 and FLAIR hyperintensity. No midline shift, mass effect, evidence of mass lesion, ventriculomegaly, extra-axial collection or acute intracranial hemorrhage. Cervicomedullary junction and pituitary are within normal limits. Vascular: Major intracranial vascular flow voids are preserved. Skull and upper cervical spine: Negative for age visible cervical spine. Visualized bone marrow signal is within normal limits. Sinuses/Orbits: Postoperative changes to both globes. Otherwise negative orbits. Paranasal sinuses and mastoids are stable and well pneumatized. Other: Grossly normal visible internal auditory structures. Negative visible scalp and face. MRA HEAD FINDINGS Antegrade flow in the posterior circulation with fairly codominant distal vertebral arteries. No distal vertebral stenosis. Patent left PICA origin, vertebrobasilar junction, dominant appearing right AICA. Patent basilar artery. Patent SCA and PCA origins. Posterior communicating arteries are diminutive or absent. Bilateral PCA branches are within normal limits. Antegrade flow in both ICA siphons. No siphon stenosis. Patent carotid termini. Normal MCA and ACA origins. Diminutive or absent anterior communicating artery. MCA M1 segments and both MCA bifurcations are patent without stenosis. On the right no MCA branch occlusion is identified. Visible left MCA and bilateral ACA branches are within normal limits. IMPRESSION: 1. Patchy acute Right MCA territory infarcts, including at the lateral right motor strip. No associated hemorrhage or mass effect. 2. No Right MCA branch occlusion identified, intracranial MRA is negative for age. Electronically Signed   By: Odessa Fleming M.D.   On: 09/10/2020 18:40   ECHOCARDIOGRAM COMPLETE  Result Date: 09/10/2020    ECHOCARDIOGRAM REPORT   Patient Name:   Jason Hunter Date of Exam:  09/10/2020 Medical Rec #:  [id number]     Height:       70.0 in Accession #:    [id number]    Weight:       180.0 lb Date of Birth:  1928/07/18     BSA:          1.996 m Patient Age:    89 years      BP:           154/86 mmHg Patient Gender: M             HR:           107 bpm. Exam Location:  Inpatient Procedure: 2D Echo Indications:    TIA 435.9  History:        Patient has no prior history of Echocardiogram examinations.  Arrythmias:Atrial Fibrillation; Risk Factors:Diabetes,                 Dyslipidemia and Hypertension.  Sonographer:    Celene SkeenVijay Shankar RDCS (AE) Referring Phys: [id number] JENNIFER YATES  Sonographer Comments: patient was very agreeable. however, at times had difficulty following breathing instructions in regards to holding breath. IMPRESSIONS  1. Left ventricular ejection fraction, by estimation, is 60 to 65%. The left ventricle has normal function. The left ventricle has no regional wall motion abnormalities. Left ventricular diastolic parameters are indeterminate.  2. Right ventricular systolic function is normal. The right ventricular size is normal. There is moderately elevated pulmonary artery systolic pressure. The estimated right ventricular systolic pressure is 55.4 mmHg.  3. Left atrial size was mildly dilated.  4. Right atrial size was mildly dilated.  5. The mitral valve is normal in structure. Trivial mitral valve regurgitation. No evidence of mitral stenosis.  6. The aortic valve is tricuspid. Aortic valve regurgitation is not visualized. Mild aortic valve sclerosis is present, with no evidence of aortic valve stenosis.  7. The inferior vena cava is dilated in size with <50% respiratory variability, suggesting right atrial pressure of 15 mmHg.  8. The patient was in atrial fibrillation. FINDINGS  Left Ventricle: Left ventricular ejection fraction, by estimation, is 60 to 65%. The left ventricle has normal function. The left ventricle has no regional wall motion abnormalities.  The left ventricular internal cavity size was normal in size. There is  no left ventricular hypertrophy. Left ventricular diastolic parameters are indeterminate. Right Ventricle: The right ventricular size is normal. No increase in right ventricular wall thickness. Right ventricular systolic function is normal. There is moderately elevated pulmonary artery systolic pressure. The tricuspid regurgitant velocity is 3.18 m/s, and with an assumed right atrial pressure of 15 mmHg, the estimated right ventricular systolic pressure is 55.4 mmHg. Left Atrium: Left atrial size was mildly dilated. Right Atrium: Right atrial size was mildly dilated. Pericardium: There is no evidence of pericardial effusion. Mitral Valve: The mitral valve is normal in structure. Mild mitral annular calcification. Trivial mitral valve regurgitation. No evidence of mitral valve stenosis. Tricuspid Valve: The tricuspid valve is normal in structure. Tricuspid valve regurgitation is trivial. Aortic Valve: The aortic valve is tricuspid. Aortic valve regurgitation is not visualized. Mild aortic valve sclerosis is present, with no evidence of aortic valve stenosis. Pulmonic Valve: The pulmonic valve was normal in structure. Pulmonic valve regurgitation is not visualized. Aorta: The aortic root is normal in size and structure. Venous: The inferior vena cava is dilated in size with less than 50% respiratory variability, suggesting right atrial pressure of 15 mmHg. IAS/Shunts: No atrial level shunt detected by color flow Doppler.  LEFT VENTRICLE PLAX 2D LVIDd:         4.90 cm LVIDs:         2.50 cm LV PW:         0.80 cm LV IVS:        0.90 cm LVOT diam:     2.30 cm LV SV:         64 LV SV Index:   32 LVOT Area:     4.15 cm  LEFT ATRIUM           Index LA diam:      5.10 cm 2.56 cm/m LA Vol (A2C): 47.7 ml 23.90 ml/m  AORTIC VALVE LVOT Vmax:   75.80 cm/s LVOT Vmean:  52.600 cm/s LVOT VTI:    0.155 m  AORTA Ao Root diam: 3.10 cm MITRAL VALVE                 TRICUSPID VALVE MV Area (PHT): 6.96 cm     TR Peak grad:   40.4 mmHg MV Decel Time: 109 msec     TR Vmax:        318.00 cm/s MV E velocity: 104.00 cm/s                             SHUNTS                             Systemic VTI:  0.16 m                             Systemic Diam: 2.30 cm Marca Ancona MD Electronically signed by Marca Ancona MD Signature Date/Time: 09/10/2020/4:30:53 PM    Final    CT HEAD CODE STROKE WO CONTRAST  Result Date: 09/10/2020 CLINICAL DATA:  Code stroke.  Left facial droop and slurred speech. EXAM: CT HEAD WITHOUT CONTRAST TECHNIQUE: Contiguous axial images were obtained from the base of the skull through the vertex without intravenous contrast. COMPARISON:  None. FINDINGS: Brain: There is no evidence of an acute infarct, intracranial hemorrhage, mass, midline shift, or extra-axial fluid collection. The ventricles and sulci are within normal limits for age. Hypodensities in the cerebral white matter bilaterally are nonspecific but compatible with mild chronic small vessel ischemic disease. Vascular: Calcified atherosclerosis at the skull base. No hyperdense vessel. Skull: No fracture or suspicious osseous lesion. Sinuses/Orbits: Minimal mucosal thickening in the left maxillary sinus. Clear mastoid air cells. Bilateral cataract extraction. Other: None. ASPECTS [facility] Stroke Program Early CT Score) - Ganglionic level infarction (caudate, lentiform nuclei, internal capsule, insula, M1-M3 cortex): 7 - Supraganglionic infarction (M4-M6 cortex): 3 Total score (0-10 with 10 being normal): 10 IMPRESSION: 1. No evidence of acute intracranial abnormality. 2. ASPECTS is 10. 3. Mild chronic small vessel ischemic disease. These results were communicated to Dr. Pearlean Brownie at 1:27 pm on 09/10/2020 by text page via the [facility] messaging system. Electronically Signed   By: Sebastian Ache M.D.   On: 09/10/2020 13:27   VAS US CAROTID (at [facility] and [facility] only)  Result Date: 09/11/2020 Carotid Arterial Duplex  Study Indications:       TIA. Risk Factors:      Hypertension, Diabetes. Comparison Study:  no prior Performing Technologist: Blanch Media RVS  Examination Guidelines: A complete evaluation includes B-mode imaging, spectral Doppler, color Doppler, and power Doppler as needed of all accessible portions of each vessel. Bilateral testing is considered an integral part of a complete examination. Limited examinations for reoccurring indications may be performed as noted.  Right Carotid Findings: +----------+--------+--------+--------+------------------+--------+           PSV cm/sEDV cm/sStenosisPlaque DescriptionComments +----------+--------+--------+--------+------------------+--------+ CCA Prox  74                      heterogenous               +----------+--------+--------+--------+------------------+--------+ CCA Distal79      13              heterogenous               +----------+--------+--------+--------+------------------+--------+ ICA Prox  42      9  1-39%   heterogenous               +----------+--------+--------+--------+------------------+--------+ ICA Distal82      18                                         +----------+--------+--------+--------+------------------+--------+ ECA       76                                                 +----------+--------+--------+--------+------------------+--------+ +----------+--------+-------+--------+-------------------+           PSV cm/sEDV cmsDescribeArm Pressure (mmHG) +----------+--------+-------+--------+-------------------+ [id number]                                         +----------+--------+-------+--------+-------------------+ +---------+--------+--+--------+-+ VertebralPSV cm/s37EDV cm/s9 +---------+--------+--+--------+-+  Left Carotid Findings: +----------+--------+--------+--------+------------------+--------+           PSV cm/sEDV cm/sStenosisPlaque DescriptionComments  +----------+--------+--------+--------+------------------+--------+ CCA Prox  83      11              heterogenous               +----------+--------+--------+--------+------------------+--------+ CCA Distal62      9               heterogenous               +----------+--------+--------+--------+------------------+--------+ ICA Prox  27      9       1-39%   heterogenous               +----------+--------+--------+--------+------------------+--------+ ICA Distal69      12                                         +----------+--------+--------+--------+------------------+--------+ ECA       63                                                 +----------+--------+--------+--------+------------------+--------+ +----------+--------+--------+--------+-------------------+           PSV cm/sEDV cm/sDescribeArm Pressure (mmHG) +----------+--------+--------+--------+-------------------+ [id number]                                          +----------+--------+--------+--------+-------------------+ +---------+--------+--+--------+-+---------+ VertebralPSV cm/s29EDV cm/s9Antegrade +---------+--------+--+--------+-+---------+   Summary: Right Carotid: Velocities in the right ICA are consistent with a 1-39% stenosis. Left Carotid: Velocities in the left ICA are consistent with a 1-39% stenosis. Vertebrals: Bilateral vertebral arteries demonstrate antegrade flow. *See table(s) above for measurements and observations.     Preliminary     I independently reviewed the above imaging studies.  Impression/Recommendation 1. Traumatic Foley catheter of time causing gross hematuria 2. CVA: MRI 09/10/2020 with right MCA infarct.  On Eliquis. 3. Atrial fibrillation: On Eliquis 4. History of hypertension, dyslipidemia, diabetes  -Bedside cystoscopy demonstrated false passage with prior traumatic Foley catheter attempts into the bulbar  urethra. There is significant amount of clot  present within the bulbar urethra. I was able to navigate beyond this through a moderately obstructing prostate and into the bladder. Cystoscopy was limited given the debris and hematuria present. I then placed a 20 [place] catheter over a wire with return of light pink urine. The Foley catheter was irrigated until it returned clear. -Leave Foley catheter to gravity. Would leave in for at least 7 days. Can attempt void trial once more mobile prior to discharge or in the office. Will message schedulers to arrange. -Manually irrigate Foley catheter every 6 hours and as needed for clots or decreased drainage. No need to start CBI at this point as urine clear then no significant clots in the bladder. -Okay for anticoagulation from GU standpoint.  Jannifer Hick 09/12/2020, 12:20 AM  Matt R. Michela Herst MD [facility]  Pager: [phone]   CC: John Giovanni, MD

## 2020-09-12 NOTE — Assessment & Plan Note (Addendum)
-  Greatly appreciate urology assistance overnight -Unsuccessful Foley attempts, then likely a worsening development of clots due to traumatic Foley -Urology was able to place catheter overnight which is recommended to remain in for about 1 week and he will be discharged home with catheter in place and outpatient follow-up -Okay to continue Eliquis per urology as well -Still having some clots later this morning, will continue on intermittent irrigation and if needed start on CBI -Creatinine has also bumped in setting of temporary obstruction yesterday.  Repeat BMP this afternoon shows improvement after obstruction has been relieved -Renal function continues to improve after placement of Foley -Foley continued at discharge and patient will follow up outpatient with urology

## 2020-09-12 NOTE — Progress Notes (Signed)
Urine draining clear yellow. Keep foley to gravity. Irrigate prn. Patient comfortable.  Matt R. Willaim Mode MD [facility]  Pager: [phone]

## 2020-09-12 NOTE — Procedures (Signed)
Procedure note  Preoperative diagnosis:  1.  Gross hematuria  Postoperative diagnosis: 1.  Gross hematuria  Procedure(s): 1.  Bedside cystoscopy 2. Difficult Foley catheter placement over a wire  Surgeon: Jettie Pagan, MD  Assistants:  None  Anesthesia:  General  Complications:  None  EBL:  minimal  Specimens: 1. none  Drains/Catheters: 1. 20 French Councill catheter  Findings: 1. Traumatic false passage identified within the bulbar urethra. I was able to navigate beyond this and into the bladder. Cystoscopy of the bladder was limited given the amount of hematuria present.  Indication:  Jason Hunter is a 86 y.o. male admitted with CVA on Eliquis he was found to be incompletely retaining. And a neck catheter was attempted around 7 PM 09/11/2020 with return of clots. He has been clot retention for the past 5 hours prior to urology being called.  Description of procedure: After the patient was prepped and draped in the usual sterile fashion, flexible cystourethroscopy was performed.  This revealed traumatic false passage within the bulbar urethra. I was able to navigate beyond this and into the bladder where I passed a 0.038 sensor wire. Systematic examination of the bladder was limited due to hematuria and clots present. Over the wire passed a 20 French Foley catheter with return of initially light pink urine. I then irrigated the catheter until it returned clear. I was able to irrigate minimal amounts of clots. Irrigation was performed easily. I suspectsignificant clot within the bladder and rather hematuria emanating from his bulbar urethra from prior traumatic Foley attempt.  Plan: Leave Foley catheter to gravity. Manually irrigate every 6 hours and as needed for clots or decreased drainage. Will leave Foley catheter in place for least 7 days. Can void trial prior to discharge or follow-up in the office. Will message schedulers to arrange.  Matt R. Laytoya Ion MD [facility]   Pager: [phone]

## 2020-09-12 NOTE — Progress Notes (Signed)
PROGRESS NOTE    Jason Hunter   ZOX:[id number]  DOB: 07-07-1928  DOA: 09/10/2020     1  PCP: Kirby FunkGriffin, John, MD  CC: left side weakness  Hospital Course: [name] is a 86 y.o. male with medical history significant of HTN; HLD, afib on Eliquis, and DM presented with L facial droop.   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   No dysphagia.  He fatigues easily but attributes that to age.  No headache.  He did have a bit of visual disturbance.  He has a large goiter, has had it for years, unchanged.    He underwent MRI brain which revealed patchy acute Right MCA territory infarcts, including at the lateral right motor strip. No associated hemorrhage or mass effect. MRA brain was normal. Carotid ultrasound was also unreavealing for significant stenosis.  Echo was also unremarkable, mildly elevated right side pressures but EF normal and no diastolic dysfunction.   On evening of 09/11/2020 he developed urinary retention and unsuccessful Foley catheter placements.  Urology was consulted and performed a bedside cystoscopy.  Urethra had traumatic Foley signs and and indwelling catheter was able to be placed successfully over guidewire.  He had clearing of urine after catheter placed but then developed further clots.  He was started on intermittent irrigation.  Interval History:  Urinary retention overnight and required foley placement with urology. Urine cleared but then became bloody/clotted again this morning. Creat also elevated after obstruction. Foley remains in place.   Old records reviewed in assessment of this patient  ROS: Constitutional: negative for chills and fevers, Respiratory: negative for cough, Cardiovascular: negative for chest pain and Gastrointestinal: negative for abdominal pain  Assessment & Plan: * CVA (cerebral vascular accident) (HCC) - patient presented with left side weakness and facial droop; MRI  brain shows right MCA infarcts. Vascular studies and echo are normal - some noncompliance with Eliquis at home due to occasional bloody penile discharge; no thrombus seen on TTE - placed on statin - continue eliquis - per PT needs 24/7 assistance at home; patient states son would be able to take care of him at home -More conversation had bedside this morning about remaining out of work which he now understands and is amenable to  Atrial fibrillation (HCC) - patient on eliquis although states skips doses due to occasional bloody urine? Hgb stable - continue eliquis for now as patient tolerates   Acute urinary retention -Greatly appreciate urology assistance overnight -Unsuccessful Foley attempts, then likely a worsening development of clots due to traumatic Foley -Urology was able to place catheter overnight which is recommended to remain in for about 1 week and he will be discharged home with catheter in place and outpatient follow-up -Okay to continue Eliquis per urology as well -Still having some clots later this morning, will continue on intermittent irrigation and if needed start on CBI -Creatinine has also bumped in setting of temporary obstruction yesterday.  Repeat BMP this afternoon shows improvement after obstruction has been relieved -Continue Foley and repeat BMP in a.m.  Essential hypertension - BP essentially at goal for his age currently - continue holding home meds for now - will resume slowly if needed  Dyslipidemia - continue statin - LDL 75  Diabetes with neurologic complications (HCC) - A1c 6% - continue SSI and CBGs   Antimicrobials: none  DVT prophylaxis: Eliquis Code Status: Full Family Communication: none present Disposition Plan:  Status is: Inpatient  Remains inpatient appropriate because:Unsafe d/c plan and Inpatient level of care appropriate due to severity of illness   Dispo: The patient is from: Home              Anticipated d/c is to: Home               Anticipated d/c date is: 1 day              Patient currently is not medically stable to d/c.  Objective: Blood pressure 126/79, pulse 90, temperature 98.2 F (36.8 C), temperature source Oral, resp. rate 18, height 5\' 11"  (1.803 m), weight 80.1 kg, SpO2 100 %.  Examination: General appearance: alert, cooperative and no distress Head: Normocephalic, without obvious abnormality, atraumatic Eyes: EOMI Lungs: clear to auscultation bilaterally Heart: irregularly irregular rhythm and S1, S2 normal Abdomen: normal findings: bowel sounds normal and soft, non-tender Extremities: no edema Skin: mobility and turgor normal Neurologic: Grossly normal  Consultants:   neuro  Procedures:   none  Data Reviewed: I have personally reviewed following labs and imaging studies Results for orders placed or performed during the hospital encounter of 09/10/20 (from the past 24 hour(s))  Glucose, capillary     Status: Abnormal   Collection Time: 09/11/20  4:12 PM  Result Value Ref Range   Glucose-Capillary 109 (H) 70 - 99 mg/dL  Glucose, capillary     Status: Abnormal   Collection Time: 09/11/20  9:35 PM  Result Value Ref Range   Glucose-Capillary 151 (H) 70 - 99 mg/dL   Comment 1 Notify RN    Comment 2 Document in Chart   Basic metabolic panel     Status: Abnormal   Collection Time: 09/12/20  3:20 AM  Result Value Ref Range   Sodium 139 135 - 145 mmol/L   Potassium 3.2 (L) 3.5 - 5.1 mmol/L   Chloride 108 98 - 111 mmol/L   CO2 22 22 - 32 mmol/L   Glucose, Bld 138 (H) 70 - 99 mg/dL   BUN 28 (H) 8 - 23 mg/dL   Creatinine, Ser 09/14/20 (H) 0.61 - 1.24 mg/dL   Calcium 7.8 (L) 8.9 - 10.3 mg/dL   GFR, Estimated 20 (L) >60 mL/min   Anion gap 9 5 - 15  CBC with Differential/Platelet     Status: Abnormal   Collection Time: 09/12/20  3:20 AM  Result Value Ref Range   WBC 11.7 (H) 4.0 - 10.5 K/uL   RBC 3.76 (L) 4.22 - 5.81 MIL/uL   Hemoglobin 11.4 (L) 13.0 - 17.0 g/dL   HCT 09/14/20 (L) 39 -  52 %   MCV 93.1 80.0 - 100.0 fL   MCH 30.3 26.0 - 34.0 pg   MCHC 32.6 30.0 - 36.0 g/dL   RDW 93.2 67.1 - 24.5 %   Platelets 294 150 - 400 K/uL   nRBC 0.0 0.0 - 0.2 %   Neutrophils Relative % 76 %   Neutro Abs 8.9 (H) 1.7 - 7.7 K/uL   Lymphocytes Relative 10 %   Lymphs Abs 1.1 0.7 - 4.0 K/uL   Monocytes Relative 13 %   Monocytes Absolute 1.5 (H) 0.1 - 1.0 K/uL   Eosinophils Relative 0 %   Eosinophils Absolute 0.0 0.0 - 0.5 K/uL   Basophils Relative 0 %   Basophils Absolute 0.0 0.0 - 0.1 K/uL   Immature Granulocytes 1 %   Abs Immature Granulocytes 0.11 (H) 0.00 - 0.07 K/uL  Magnesium  Status: Abnormal   Collection Time: 09/12/20  3:20 AM  Result Value Ref Range   Magnesium 1.6 (L) 1.7 - 2.4 mg/dL  Glucose, capillary     Status: Abnormal   Collection Time: 09/12/20  6:23 AM  Result Value Ref Range   Glucose-Capillary 112 (H) 70 - 99 mg/dL   Comment 1 Notify RN    Comment 2 Document in Chart   Glucose, capillary     Status: Abnormal   Collection Time: 09/12/20 11:34 AM  Result Value Ref Range   Glucose-Capillary 184 (H) 70 - 99 mg/dL   Comment 1 Notify RN    Comment 2 Document in Chart   Basic metabolic panel     Status: Abnormal   Collection Time: 09/12/20 12:02 PM  Result Value Ref Range   Sodium 139 135 - 145 mmol/L   Potassium 3.4 (L) 3.5 - 5.1 mmol/L   Chloride 107 98 - 111 mmol/L   CO2 24 22 - 32 mmol/L   Glucose, Bld 186 (H) 70 - 99 mg/dL   BUN 24 (H) 8 - 23 mg/dL   Creatinine, Ser 4.54 (H) 0.61 - 1.24 mg/dL   Calcium 7.8 (L) 8.9 - 10.3 mg/dL   GFR, Estimated 30 (L) >60 mL/min   Anion gap 8 5 - 15    Recent Results (from the past 240 hour(s))  Respiratory Panel by RT PCR (Flu A&B, Covid) - Nasopharyngeal Swab     Status: None   Collection Time: 09/10/20  1:07 PM   Specimen: Nasopharyngeal Swab  Result Value Ref Range Status   SARS Coronavirus 2 by RT PCR NEGATIVE NEGATIVE Final    Comment: (NOTE) SARS-CoV-2 target nucleic acids are NOT DETECTED.  The  SARS-CoV-2 RNA is generally detectable in upper respiratoy specimens during the acute phase of infection. The lowest concentration of SARS-CoV-2 viral copies this assay can detect is 131 copies/mL. A negative result does not preclude SARS-Cov-2 infection and should not be used as the sole basis for treatment or other patient management decisions. A negative result may occur with  improper specimen collection/handling, submission of specimen other than nasopharyngeal swab, presence of viral mutation(s) within the areas targeted by this assay, and inadequate number of viral copies (<131 copies/mL). A negative result must be combined with clinical observations, patient history, and epidemiological information. The expected result is Negative.  Fact Sheet for Patients:  https://www.moore.com/  Fact Sheet for Healthcare Providers:  https://www.young.biz/  This test is no t yet approved or cleared by the [place] FDA and  has been authorized for detection and/or diagnosis of SARS-CoV-2 by FDA under an Emergency Use Authorization (EUA). This EUA will remain  in effect (meaning this test can be used) for the duration of the COVID-19 declaration under Section 564(b)(1) of the Act, 21 U.S.C. section 360bbb-3(b)(1), unless the authorization is terminated or revoked sooner.     Influenza A by PCR NEGATIVE NEGATIVE Final   Influenza B by PCR NEGATIVE NEGATIVE Final    Comment: (NOTE) The Xpert Xpress SARS-CoV-2/FLU/RSV assay is intended as an aid in  the diagnosis of influenza from Nasopharyngeal swab specimens and  should not be used as a sole basis for treatment. Nasal washings and  aspirates are unacceptable for Xpert Xpress SARS-CoV-2/FLU/RSV  testing.  Fact Sheet for Patients: https://www.moore.com/  Fact Sheet for Healthcare Providers: https://www.young.biz/  This test is not yet approved or cleared  by the [place] FDA and  has been authorized for detection and/or diagnosis of  SARS-CoV-2 by  FDA under an Emergency Use Authorization (EUA). This EUA will remain  in effect (meaning this test can be used) for the duration of the  Covid-19 declaration under Section 564(b)(1) of the Act, 21  U.S.C. section 360bbb-3(b)(1), unless the authorization is  terminated or revoked. Performed at [facility], [address] [place], [place] [address]      Radiology Studies: MR ANGIO HEAD WO CONTRAST  Result Date: 09/10/2020 CLINICAL DATA:  86 year old male with atrial fibrillation on Eliquis. Code stroke presentation earlier today. Left facial droop and slurred speech. EXAM: MRI HEAD WITHOUT CONTRAST MRA HEAD WITHOUT CONTRAST TECHNIQUE: Multiplanar, multiecho pulse sequences of the brain and surrounding structures were obtained without intravenous contrast. Angiographic images of the head were obtained using MRA technique without contrast. COMPARISON:  Plain head CT 1312 hours today. FINDINGS: MRI HEAD FINDINGS Brain: Multifocal patchy right MCA territory infarct affecting the lateral right motor strip and anterior right operculum. See series 4, image 34, series 6, image 18). No contralateral or other restricted diffusion. Minimal T2 and FLAIR hyperintensity at the affected parenchyma. No hemorrhage or mass effect. No chronic cortical encephalomalacia identified. Mild for age patchy bilateral white matter T2 and FLAIR hyperintensity. No midline shift, mass effect, evidence of mass lesion, ventriculomegaly, extra-axial collection or acute intracranial hemorrhage. Cervicomedullary junction and pituitary are within normal limits. Vascular: Major intracranial vascular flow voids are preserved. Skull and upper cervical spine: Negative for age visible cervical spine. Visualized bone marrow signal is within normal limits. Sinuses/Orbits: Postoperative changes to both globes. Otherwise negative orbits.  Paranasal sinuses and mastoids are stable and well pneumatized. Other: Grossly normal visible internal auditory structures. Negative visible scalp and face. MRA HEAD FINDINGS Antegrade flow in the posterior circulation with fairly codominant distal vertebral arteries. No distal vertebral stenosis. Patent left PICA origin, vertebrobasilar junction, dominant appearing right AICA. Patent basilar artery. Patent SCA and PCA origins. Posterior communicating arteries are diminutive or absent. Bilateral PCA branches are within normal limits. Antegrade flow in both ICA siphons. No siphon stenosis. Patent carotid termini. Normal MCA and ACA origins. Diminutive or absent anterior communicating artery. MCA M1 segments and both MCA bifurcations are patent without stenosis. On the right no MCA branch occlusion is identified. Visible left MCA and bilateral ACA branches are within normal limits. IMPRESSION: 1. Patchy acute Right MCA territory infarcts, including at the lateral right motor strip. No associated hemorrhage or mass effect. 2. No Right MCA branch occlusion identified, intracranial MRA is negative for age. Electronically Signed   By: Odessa Fleming M.D.   On: 09/10/2020 18:40   MR BRAIN WO CONTRAST  Result Date: 09/10/2020 CLINICAL DATA:  86 year old male with atrial fibrillation on Eliquis. Code stroke presentation earlier today. Left facial droop and slurred speech. EXAM: MRI HEAD WITHOUT CONTRAST MRA HEAD WITHOUT CONTRAST TECHNIQUE: Multiplanar, multiecho pulse sequences of the brain and surrounding structures were obtained without intravenous contrast. Angiographic images of the head were obtained using MRA technique without contrast. COMPARISON:  Plain head CT 1312 hours today. FINDINGS: MRI HEAD FINDINGS Brain: Multifocal patchy right MCA territory infarct affecting the lateral right motor strip and anterior right operculum. See series 4, image 34, series 6, image 18). No contralateral or other restricted diffusion.  Minimal T2 and FLAIR hyperintensity at the affected parenchyma. No hemorrhage or mass effect. No chronic cortical encephalomalacia identified. Mild for age patchy bilateral white matter T2 and FLAIR hyperintensity. No midline shift, mass effect, evidence of mass lesion, ventriculomegaly, extra-axial  collection or acute intracranial hemorrhage. Cervicomedullary junction and pituitary are within normal limits. Vascular: Major intracranial vascular flow voids are preserved. Skull and upper cervical spine: Negative for age visible cervical spine. Visualized bone marrow signal is within normal limits. Sinuses/Orbits: Postoperative changes to both globes. Otherwise negative orbits. Paranasal sinuses and mastoids are stable and well pneumatized. Other: Grossly normal visible internal auditory structures. Negative visible scalp and face. MRA HEAD FINDINGS Antegrade flow in the posterior circulation with fairly codominant distal vertebral arteries. No distal vertebral stenosis. Patent left PICA origin, vertebrobasilar junction, dominant appearing right AICA. Patent basilar artery. Patent SCA and PCA origins. Posterior communicating arteries are diminutive or absent. Bilateral PCA branches are within normal limits. Antegrade flow in both ICA siphons. No siphon stenosis. Patent carotid termini. Normal MCA and ACA origins. Diminutive or absent anterior communicating artery. MCA M1 segments and both MCA bifurcations are patent without stenosis. On the right no MCA branch occlusion is identified. Visible left MCA and bilateral ACA branches are within normal limits. IMPRESSION: 1. Patchy acute Right MCA territory infarcts, including at the lateral right motor strip. No associated hemorrhage or mass effect. 2. No Right MCA branch occlusion identified, intracranial MRA is negative for age. Electronically Signed   By: Odessa Fleming M.D.   On: 09/10/2020 18:40   VAS US CAROTID (at [facility] and [facility] only)  Result Date: 09/12/2020 Carotid  Arterial Duplex Study Indications:       TIA. Risk Factors:      Hypertension, Diabetes. Comparison Study:  no prior Performing Technologist: Blanch Media RVS  Examination Guidelines: A complete evaluation includes B-mode imaging, spectral Doppler, color Doppler, and power Doppler as needed of all accessible portions of each vessel. Bilateral testing is considered an integral part of a complete examination. Limited examinations for reoccurring indications may be performed as noted.  Right Carotid Findings: +----------+--------+--------+--------+------------------+--------+           PSV cm/sEDV cm/sStenosisPlaque DescriptionComments +----------+--------+--------+--------+------------------+--------+ CCA Prox  74                      heterogenous               +----------+--------+--------+--------+------------------+--------+ CCA Distal79      13              heterogenous               +----------+--------+--------+--------+------------------+--------+ ICA Prox  42      9       1-39%   heterogenous               +----------+--------+--------+--------+------------------+--------+ ICA Distal82      18                                         +----------+--------+--------+--------+------------------+--------+ ECA       76                                                 +----------+--------+--------+--------+------------------+--------+ +----------+--------+-------+--------+-------------------+           PSV cm/sEDV cmsDescribeArm Pressure (mmHG) +----------+--------+-------+--------+-------------------+ [id number]                                         +----------+--------+-------+--------+-------------------+ +---------+--------+--+--------+-+  VertebralPSV cm/s37EDV cm/s9 +---------+--------+--+--------+-+  Left Carotid Findings: +----------+--------+--------+--------+------------------+--------+           PSV cm/sEDV cm/sStenosisPlaque  DescriptionComments +----------+--------+--------+--------+------------------+--------+ CCA Prox  83      11              heterogenous               +----------+--------+--------+--------+------------------+--------+ CCA Distal62      9               heterogenous               +----------+--------+--------+--------+------------------+--------+ ICA Prox  27      9       1-39%   heterogenous               +----------+--------+--------+--------+------------------+--------+ ICA Distal69      12                                         +----------+--------+--------+--------+------------------+--------+ ECA       63                                                 +----------+--------+--------+--------+------------------+--------+ +----------+--------+--------+--------+-------------------+           PSV cm/sEDV cm/sDescribeArm Pressure (mmHG) +----------+--------+--------+--------+-------------------+ [id number]                                          +----------+--------+--------+--------+-------------------+ +---------+--------+--+--------+-+---------+ VertebralPSV cm/s29EDV cm/s9Antegrade +---------+--------+--+--------+-+---------+   Summary: Right Carotid: Velocities in the right ICA are consistent with a 1-39% stenosis. Left Carotid: Velocities in the left ICA are consistent with a 1-39% stenosis. Vertebrals: Bilateral vertebral arteries demonstrate antegrade flow. *See table(s) above for measurements and observations.  Electronically signed by Fabienne Bruns MD on 09/12/2020 at 11:47:26 AM.    Final    VAS US CAROTID (at [facility] and [facility] only)  Final Result    MR BRAIN WO CONTRAST  Final Result    MR ANGIO HEAD WO CONTRAST  Final Result    CT HEAD CODE STROKE WO CONTRAST  Final Result      Scheduled Meds: . apixaban  2.5 mg Oral BID  . atorvastatin  10 mg Oral Daily  . Chlorhexidine Gluconate Cloth  6 each Topical Daily  . insulin aspart  0-15  Units Subcutaneous TID WC   PRN Meds: acetaminophen **OR** acetaminophen (TYLENOL) oral liquid 160 mg/5 mL **OR** acetaminophen, morphine injection, senna-docusate Continuous Infusions: . sodium chloride 50 mL/hr at 09/12/20 0947  . sodium chloride irrigation       LOS: 1 day  Time spent: Greater than 50% of the 35 minute visit was spent in counseling/coordination of care for the patient as laid out in the A&P.   Lewie Chamber, MD [facility] 09/12/2020, 3:56 PM

## 2020-09-13 DIAGNOSIS — I639 Cerebral infarction, unspecified: Secondary | ICD-10-CM | POA: Diagnosis not present

## 2020-09-13 DIAGNOSIS — I4891 Unspecified atrial fibrillation: Secondary | ICD-10-CM | POA: Diagnosis not present

## 2020-09-13 DIAGNOSIS — R338 Other retention of urine: Secondary | ICD-10-CM | POA: Diagnosis not present

## 2020-09-13 LAB — CBC WITH DIFFERENTIAL/PLATELET
Abs Immature Granulocytes: 0.14 10*3/uL — ABNORMAL HIGH (ref 0.00–0.07)
Basophils Absolute: 0 10*3/uL (ref 0.0–0.1)
Basophils Relative: 0 %
Eosinophils Absolute: 0.2 10*3/uL (ref 0.0–0.5)
Eosinophils Relative: 2 %
HCT: 32.6 % — ABNORMAL LOW (ref 39.0–52.0)
Hemoglobin: 10.7 g/dL — ABNORMAL LOW (ref 13.0–17.0)
Immature Granulocytes: 1 %
Lymphocytes Relative: 21 %
Lymphs Abs: 2.1 10*3/uL (ref 0.7–4.0)
MCH: 31.1 pg (ref 26.0–34.0)
MCHC: 32.8 g/dL (ref 30.0–36.0)
MCV: 94.8 fL (ref 80.0–100.0)
Monocytes Absolute: 1.4 10*3/uL — ABNORMAL HIGH (ref 0.1–1.0)
Monocytes Relative: 14 %
Neutro Abs: 6.3 10*3/uL (ref 1.7–7.7)
Neutrophils Relative %: 62 %
Platelets: 283 10*3/uL (ref 150–400)
RBC: 3.44 MIL/uL — ABNORMAL LOW (ref 4.22–5.81)
RDW: 13.9 % (ref 11.5–15.5)
WBC: 10.1 10*3/uL (ref 4.0–10.5)
nRBC: 0 % (ref 0.0–0.2)

## 2020-09-13 LAB — BASIC METABOLIC PANEL
Anion gap: 9 (ref 5–15)
BUN: 17 mg/dL (ref 8–23)
CO2: 23 mmol/L (ref 22–32)
Calcium: 7.5 mg/dL — ABNORMAL LOW (ref 8.9–10.3)
Chloride: 107 mmol/L (ref 98–111)
Creatinine, Ser: 1.36 mg/dL — ABNORMAL HIGH (ref 0.61–1.24)
GFR, Estimated: 49 mL/min — ABNORMAL LOW (ref >60)
Glucose, Bld: 98 mg/dL (ref 70–99)
Potassium: 3.3 mmol/L — ABNORMAL LOW (ref 3.5–5.1)
Sodium: 139 mmol/L (ref 135–145)

## 2020-09-13 LAB — GLUCOSE, CAPILLARY
Glucose-Capillary: 104 mg/dL — ABNORMAL HIGH (ref 70–99)
Glucose-Capillary: 149 mg/dL — ABNORMAL HIGH (ref 70–99)
Glucose-Capillary: 109 mg/dL — ABNORMAL HIGH (ref 70–99)

## 2020-09-13 LAB — MAGNESIUM: Magnesium: 1.6 mg/dL — ABNORMAL LOW (ref 1.7–2.4)

## 2020-09-13 MED ORDER — POTASSIUM CHLORIDE CRYS ER 20 MEQ PO TBCR
40.0000 meq | EXTENDED_RELEASE_TABLET | Freq: Once | ORAL | Status: AC
  Administered 2020-09-13: 40 meq via ORAL
  Filled 2020-09-13: qty 2

## 2020-09-13 MED ORDER — MAGNESIUM OXIDE 400 (241.3 MG) MG PO TABS
800.0000 mg | ORAL_TABLET | Freq: Once | ORAL | Status: AC
  Administered 2020-09-13: 800 mg via ORAL
  Filled 2020-09-13: qty 2

## 2020-09-13 MED ORDER — APIXABAN 5 MG PO TABS
5.0000 mg | ORAL_TABLET | Freq: Two times a day (BID) | ORAL | Status: DC
  Administered 2020-09-13: 5 mg via ORAL
  Filled 2020-09-13: qty 1

## 2020-09-13 MED ORDER — ATORVASTATIN CALCIUM 10 MG PO TABS
10.0000 mg | ORAL_TABLET | Freq: Every day | ORAL | 3 refills | Status: DC
Start: 2020-09-14 — End: 2020-10-15

## 2020-09-13 NOTE — Progress Notes (Signed)
Physical Therapy Treatment Patient Details Name: Jason Hunter MRN: [id number] DOB: 1927/11/02 Today's Date: 09/13/2020    History of Present Illness Pt is a 86 year old male who presented with L facial droop, impaired speech, visual changes, and L side weakness. Pt did not receive tPA. NIHSS of 3. MRI and MRA of head revealed acute R MCA infarcts. PMH significant for: HTN, a-fib, and DM.    PT Comments    Pt motivated to participate in therapy. He required S/min guard assist transfers and min guard assist ambulation 300' with RW. Pt in recliner at end of session.   Follow Up Recommendations  Home health PT;Supervision for mobility/OOB     Equipment Recommendations  Rolling walker with 5" wheels    Recommendations for Other Services       Precautions / Restrictions Precautions Precautions: Fall    Mobility  Bed Mobility Overal bed mobility: Modified Independent                Transfers Overall transfer level: Needs assistance Equipment used: Ambulation equipment used Transfers: Sit to/from Stand;Stand Pivot Transfers Sit to Stand: Supervision Stand pivot transfers: Min guard          Ambulation/Gait Ambulation/Gait assistance: Min guard Gait Distance (Feet): 300 Feet Assistive device: Rolling walker (2 wheeled) Gait Pattern/deviations: Step-through pattern;Decreased stride length Gait velocity: decreased Gait velocity interpretation: 1.31 - 2.62 ft/sec, indicative of limited community ambulator General Gait Details: steady gait with RW   Stairs             Wheelchair Mobility    Modified Rankin (Stroke Patients Only) Modified Rankin (Stroke Patients Only) Pre-Morbid Rankin Score: No symptoms Modified Rankin: Moderately severe disability     Balance Overall balance assessment: Needs assistance Sitting-balance support: No upper extremity supported;Feet supported Sitting balance-Leahy Scale: Good     Standing balance support: No upper  extremity supported;During functional activity;Bilateral upper extremity supported Standing balance-Leahy Scale: Good Standing balance comment: Able to ambulate without AD. RW improves stability                            Cognition Arousal/Alertness: Awake/alert Behavior During Therapy: WFL for tasks assessed/performed Overall Cognitive Status: Impaired/Different from baseline Area of Impairment: Safety/judgement;Problem solving;Awareness                     Memory: Decreased short-term memory   Safety/Judgement: Decreased awareness of deficits;Decreased awareness of safety Awareness: Emergent Problem Solving: Difficulty sequencing;Requires verbal cues        Exercises      General Comments General comments (skin integrity, edema, etc.): Bloody drainage noted on bed pad from foley catheter. NT notified.      Pertinent Vitals/Pain Pain Assessment: No/denies pain    Home Living                      Prior Function            PT Goals (current goals can now be found in the care plan section) Acute Rehab PT Goals Patient Stated Goal: home Progress towards PT goals: Progressing toward goals    Frequency    Min 3X/week      PT Plan Current plan remains appropriate    Co-evaluation              AM-PAC PT "6 Clicks" Mobility   Outcome Measure  Help needed turning from your back to your side  while in a flat bed without using bedrails?: None Help needed moving from lying on your back to sitting on the side of a flat bed without using bedrails?: None Help needed moving to and from a bed to a chair (including a wheelchair)?: A Little Help needed standing up from a chair using your arms (e.g., wheelchair or bedside chair)?: A Little Help needed to walk in hospital room?: A Little Help needed climbing 3-5 steps with a railing? : A Little 6 Click Score: 20    End of Session Equipment Utilized During Treatment: Gait belt Activity  Tolerance: Patient tolerated treatment well Patient left: in chair;with call bell/phone within reach Nurse Communication: Mobility status PT Visit Diagnosis: Unsteadiness on feet (R26.81);Other abnormalities of gait and mobility (R26.89);Other symptoms and signs involving the nervous system (R29.898)     Time: 1250-1316 PT Time Calculation (min) (ACUTE ONLY): 26 min  Charges:  $Gait Training: 23-37 mins                     [name], [place]  Office # [phone] Pager [phone]    [name] 09/13/2020, 3:35 PM

## 2020-09-13 NOTE — Progress Notes (Signed)
Subjective: Pain controlled. Tolerating foley. Foley clear yellow.  Objective: Vital signs in last 24 hours: Temp:  [97.7 F (36.5 C)-99.3 F (37.4 C)] 98.3 F (36.8 C) (11/14 1007) Pulse Rate:  [73-96] 82 (11/14 1007) Resp:  [16-18] 18 (11/14 1007) BP: (102-127)/(62-87) 127/81 (11/14 1007) SpO2:  [100 %] 100 % (11/14 1007)  Intake/Output from previous day: 11/13 0701 - 11/14 0700 In: 1015.7 [I.V.:905.7] Out: 2700 [Urine:2700] Intake/Output this shift: Total I/O In: 528.3 [I.V.:528.3] Out: -    UOP: 2.7L clear  Physical Exam:  General: Alert and oriented CV: RRR Lungs: Clear Abdomen: Soft, ND, NT Ext: NT, No erythema  Lab Results: Recent Labs    09/10/20 1315 09/12/20 0320 09/13/20 0104  HGB 13.6 11.4* 10.7*  HCT 40.0 35.0* 32.6*   BMET Recent Labs    09/12/20 1202 09/13/20 0104  NA 139 139  K 3.4* 3.3*  CL 107 107  CO2 24 23  GLUCOSE 186* 98  BUN 24* 17  CREATININE 2.06* 1.36*  CALCIUM 7.8* 7.5*     Studies/Results: VAS US CAROTID (at [facility] and [facility] only)  Result Date: 09/12/2020 Carotid Arterial Duplex Study Indications:       TIA. Risk Factors:      Hypertension, Diabetes. Comparison Study:  no prior Performing Technologist: Blanch Media RVS  Examination Guidelines: A complete evaluation includes B-mode imaging, spectral Doppler, color Doppler, and power Doppler as needed of all accessible portions of each vessel. Bilateral testing is considered an integral part of a complete examination. Limited examinations for reoccurring indications may be performed as noted.  Right Carotid Findings: +----------+--------+--------+--------+------------------+--------+           PSV cm/sEDV cm/sStenosisPlaque DescriptionComments +----------+--------+--------+--------+------------------+--------+ CCA Prox  74                      heterogenous               +----------+--------+--------+--------+------------------+--------+ CCA Distal79      13               heterogenous               +----------+--------+--------+--------+------------------+--------+ ICA Prox  42      9       1-39%   heterogenous               +----------+--------+--------+--------+------------------+--------+ ICA Distal82      18                                         +----------+--------+--------+--------+------------------+--------+ ECA       76                                                 +----------+--------+--------+--------+------------------+--------+ +----------+--------+-------+--------+-------------------+           PSV cm/sEDV cmsDescribeArm Pressure (mmHG) +----------+--------+-------+--------+-------------------+ [id number]                                         +----------+--------+-------+--------+-------------------+ +---------+--------+--+--------+-+ VertebralPSV cm/s37EDV cm/s9 +---------+--------+--+--------+-+  Left Carotid Findings: +----------+--------+--------+--------+------------------+--------+           PSV cm/sEDV cm/sStenosisPlaque DescriptionComments +----------+--------+--------+--------+------------------+--------+ CCA Prox  83  11              heterogenous               +----------+--------+--------+--------+------------------+--------+ CCA Distal62      9               heterogenous               +----------+--------+--------+--------+------------------+--------+ ICA Prox  27      9       1-39%   heterogenous               +----------+--------+--------+--------+------------------+--------+ ICA Distal69      12                                         +----------+--------+--------+--------+------------------+--------+ ECA       63                                                 +----------+--------+--------+--------+------------------+--------+ +----------+--------+--------+--------+-------------------+           PSV cm/sEDV cm/sDescribeArm Pressure (mmHG)  +----------+--------+--------+--------+-------------------+ [id number]                                          +----------+--------+--------+--------+-------------------+ +---------+--------+--+--------+-+---------+ VertebralPSV cm/s29EDV cm/s9Antegrade +---------+--------+--+--------+-+---------+   Summary: Right Carotid: Velocities in the right ICA are consistent with a 1-39% stenosis. Left Carotid: Velocities in the left ICA are consistent with a 1-39% stenosis. Vertebrals: Bilateral vertebral arteries demonstrate antegrade flow. *See table(s) above for measurements and observations.  Electronically signed by Fabienne Bruns MD on 09/12/2020 at 11:47:26 AM.    Final     Assessment/Plan: 1. Traumatic Foley catheter of time causing gross hematuria. S/p cysto/foley over wire on 11/13.  2. CVA: MRI 09/10/2020 with right MCA infarct.  On Eliquis. 3. Atrial fibrillation: On Eliquis 4. History of hypertension, dyslipidemia, diabetes  -Urine clear with slight pink tinge -Leave catheter in place for at least 7 days -Ok for [facility] from GU standpoint -Following peripherally   LOS: 2 days   Jannifer Hick 09/13/2020, 10:09 AM Matt R. Aashvi Rezabek MD [facility]  Pager: [phone]

## 2020-09-13 NOTE — Progress Notes (Signed)
Pt discharge education and instructions completed with pt and granddaughter at bedside. Both voices understanding and denies any questions. Pt IV and telemetry removed prior to discharge. Pt foley remained intact, unclamped and irrigated prior to discharge as instructed by urologist. No clots seen but urine still had some blood tinged and penis continued to ooze blood but MDs aware. Pt to pick up electronically sent prescription from preferred pharmacy on file. Pt home DME walker delivered to pt at bedside. Pt transported off unit via wheelchair with belongings including home DME and granddaughter at side. Pt discharged home with granddaughter to transport him home. Dionne Bucy RN

## 2020-09-13 NOTE — Progress Notes (Signed)
Urine is clear yellow in the foley tubing. Explained bleeding around urethra from prior traumatic foley attempt is expected. Ok for d/c from GU standpoint.  Matt R. Jeaneen Cala MD [facility]  Pager: [phone]

## 2020-09-13 NOTE — Discharge Summary (Signed)
Physician Discharge Summary   Jason Hunter ZOX:[id number] DOB: 01/28/1928 DOA: 09/10/2020  PCP: Kirby Funk, MD  Admit date: 09/10/2020 Discharge date: 09/13/2020  Admitted From: home Discharging physician: Lewie Chamber, MD  Recommendations for Outpatient Follow-up:  1. May be able to decrease home BP meds 2. Follow up with urology; foley continued at discharge   Patient discharged to home in Discharge Condition: stable CODE STATUS: Full Diet recommendation:  Diet Orders (From admission, onward)    Start     Ordered   09/13/20 0000  Diet - low sodium heart healthy        09/13/20 1321   09/10/20 1449  Diet heart healthy/carb modified Room service appropriate? Yes; Fluid consistency: Thin  Diet effective ____       Comments: Effective once patient passes swallow evaluation  Question Answer Comment  Diet-HS Snack? Nothing   Room service appropriate? Yes   Fluid consistency: Thin      09/10/20 1453          Hospital Course: Jason Hunter is a 86 y.o. male with medical history significant of HTN; HLD, afib on Eliquis, and DM presented with L facial droop.   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   No dysphagia.  He fatigues easily but attributes that to age.  No headache.  He did have a bit of visual disturbance.  He has a large goiter, has had it for years, unchanged.    He underwent MRI brain which revealed patchy acute Right MCA territory infarcts, including at the lateral right motor strip. No associated hemorrhage or mass effect. MRA brain was normal. Carotid ultrasound was also unreavealing for significant stenosis.  Echo was also unremarkable, mildly elevated right side pressures but EF normal and no diastolic dysfunction.  He was recommended to continue on Eliquis.  He was also started on low-dose Lipitor.  He will follow-up with neurology at discharge as well.  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  He was in understanding of this.  On evening of 09/11/2020 he developed urinary retention and unsuccessful Foley catheter placements.  Urology was consulted and performed a bedside cystoscopy.  Urethra had traumatic Foley signs and and indwelling catheter was able to be placed successfully over guidewire.  He had clearing of urine after catheter placed but then developed further clots.  He was started on intermittent irrigation. Ultimately his urine cleared with no further clots. He did have some expected drops of blood from around foley/urethra from previous traumatic foley. This was discussed with urology prior to discharge and also in agreement.  Patient was considered stable for discharging home with outpatient follow-up with urology as anticipated.  Foley catheter maintained in place at discharge.   * CVA (cerebral vascular accident) [facility]) - patient presented with left side weakness and facial droop; MRI brain shows right MCA infarcts. Vascular studies and echo are normal - some noncompliance with Eliquis at home due to occasional bloody penile discharge; no thrombus seen on TTE - placed on statin - continue eliquis - per PT needs 24/7 assistance at home; patient states son would be able to take care of him at home -He understands recommendation to remain out of work -Outpatient follow-up with neurology  Atrial fibrillation [facility]) - patient on eliquis although states skips  doses due to occasional bloody urine? Hgb stable - continue eliquis for now as patient tolerates   Acute urinary retention -Greatly appreciate urology assistance overnight -Unsuccessful Foley attempts, then likely a worsening development of clots due to traumatic Foley -Urology was able to place catheter overnight which is recommended to remain in for about 1 week and he will be discharged home with  catheter in place and outpatient follow-up -Okay to continue Eliquis per urology as well -Still having some clots later this morning, will continue on intermittent irrigation and if needed start on CBI -Creatinine has also bumped in setting of temporary obstruction yesterday.  Repeat BMP this afternoon shows improvement after obstruction has been relieved -Renal function continues to improve after placement of Foley -Foley continued at discharge and patient will follow up outpatient with urology  Essential hypertension - BP essentially at goal for his age currently - continue home meds at discharge -May be able to decrease home medications at follow-up  Dyslipidemia - continue statin - LDL 75  Diabetes with neurologic complications (HCC) - A1c 6% - continue SSI and CBGs    The patient's chronic medical conditions were treated accordingly per the patient's home medication regimen except as noted.  On day of discharge, patient was felt deemed stable for discharge. Patient/family member advised to call PCP or come back to ER if needed.   Principal Diagnosis: CVA (cerebral vascular accident) [facility])  Discharge Diagnoses: Active Hospital Problems   Diagnosis Date Noted  . CVA (cerebral vascular accident) (HCC) 09/10/2020    Priority: High  . Atrial fibrillation (HCC)     Priority: Medium  . Acute urinary retention 09/12/2020    Priority: Low  . Diabetes with neurologic complications (HCC)   . Dyslipidemia   . Essential hypertension     Resolved Hospital Problems  No resolved problems to display.    Discharge Instructions    Ambulatory referral to Neurology   Complete by: As directed    Follow up in stroke clinic at [facility] with Ihor Austin, NP in about 4 weeks. If not available, consider Dr. Delia Heady, Dr. Jamelle Rushing, or Dr. Naomie Dean.   Diet - low sodium heart healthy   Complete by: As directed    Increase activity slowly   Complete by:  As directed      Allergies as of 09/13/2020   No Known Allergies     Medication List    TAKE these medications   amLODipine 10 MG tablet Commonly known as: NORVASC Take 10 mg by mouth daily.   atorvastatin 10 MG tablet Commonly known as: LIPITOR Take 1 tablet (10 mg total) by mouth daily. Start taking on: September 14, 2020   carvedilol 25 MG tablet Commonly known as: COREG Take 25 mg by mouth 2 (two) times daily.   Eliquis 5 MG Tabs tablet Generic drug: apixaban Take 5 mg by mouth 2 (two) times daily.   metFORMIN 500 MG 24 hr tablet Commonly known as: GLUCOPHAGE-XR Take 500 mg by mouth 2 (two) times daily.   telmisartan 80 MG tablet Commonly known as: MICARDIS Take 80 mg by mouth daily.            Durable Medical Equipment  (From admission, onward)         Start     Ordered   09/11/20 1607  For home use only DME Walker rolling  Once       Question Answer Comment  Walker: With 5 [employer]  Patient needs a walker to treat with the following condition Stroke [facility])      09/11/20 1606          Follow-up Information    [facility] Follow up in 4 week(s).   Specialty: Neurology Why: stroke clinic. office will call with appt date and time.  Contact information: [address] [place] [address] [phone]       [facility] Follow up.   Specialty: Home Health Services Why: The home health agency will contact you for the first home visit. Contact information: [address] [place] [address] [phone]        Jannifer Hick, MD. Schedule an appointment as soon as possible for a visit in 1 week(s).   Specialty: Urology Contact information: [address] [place] [address] [phone]              No Known Allergies  Consultations: Urology Neurology  Discharge Exam: BP 137/85 (BP Location: Left Arm)   Pulse 85   Temp 98 F (36.7 C) (Oral)   Resp 18   Ht  5\' 11"  (1.803 m)   Wt 80.1 kg   SpO2 99%   BMI 24.63 kg/m  General appearance: alert, cooperative and no distress Head: Normocephalic, without obvious abnormality, atraumatic Eyes: EOMI Lungs: clear to auscultation bilaterally Heart: irregularly irregular rhythm and S1, S2 normal Abdomen: normal findings: bowel sounds normal and soft, non-tender GU: foley in place; urine clear in tubing Extremities: no edema Skin: mobility and turgor normal Neurologic: mild left facial droop; strength intact  The results of significant diagnostics from this hospitalization (including imaging, microbiology, ancillary and laboratory) are listed below for reference.   Microbiology: Recent Results (from the past 240 hour(s))  Respiratory Panel by RT PCR (Flu A&B, Covid) - Nasopharyngeal Swab     Status: None   Collection Time: 09/10/20  1:07 PM   Specimen: Nasopharyngeal Swab  Result Value Ref Range Status   SARS Coronavirus 2 by RT PCR NEGATIVE NEGATIVE Final    Comment: (NOTE) SARS-CoV-2 target nucleic acids are NOT DETECTED.  The SARS-CoV-2 RNA is generally detectable in upper respiratoy specimens during the acute phase of infection. The lowest concentration of SARS-CoV-2 viral copies this assay can detect is 131 copies/mL. A negative result does not preclude SARS-Cov-2 infection and should not be used as the sole basis for treatment or other patient management decisions. A negative result may occur with  improper specimen collection/handling, submission of specimen other than nasopharyngeal swab, presence of viral mutation(s) within the areas targeted by this assay, and inadequate number of viral copies (<131 copies/mL). A negative result must be combined with clinical observations, patient history, and epidemiological information. The expected result is Negative.  Fact Sheet for Patients:  13/11/21  Fact Sheet for Healthcare Providers:   https://www.moore.com/  This test is no t yet approved or cleared by the https://www.young.biz/ FDA and  has been authorized for detection and/or diagnosis of SARS-CoV-2 by FDA under an Emergency Use Authorization (EUA). This EUA will remain  in effect (meaning this test can be used) for the duration of the COVID-19 declaration under Section 564(b)(1) of the Act, 21 U.S.C. section 360bbb-3(b)(1), unless the authorization is terminated or revoked sooner.     Influenza A by PCR NEGATIVE NEGATIVE Final   Influenza B by PCR NEGATIVE NEGATIVE Final    Comment: (NOTE) The Xpert Xpress SARS-CoV-2/FLU/RSV assay is intended as an aid in  the diagnosis of influenza from Nasopharyngeal swab specimens and  should not be used as a sole basis for treatment. Nasal washings and  aspirates are unacceptable for Xpert Xpress SARS-CoV-2/FLU/RSV  testing.  Fact Sheet for Patients: https://www.moore.com/  Fact Sheet for Healthcare Providers: https://www.young.biz/  This test is not yet approved or cleared by the [place] FDA and  has been authorized for detection and/or diagnosis of SARS-CoV-2 by  FDA under an Emergency Use Authorization (EUA). This EUA will remain  in effect (meaning this test can be used) for the duration of the  Covid-19 declaration under Section 564(b)(1) of the Act, 21  U.S.C. section 360bbb-3(b)(1), unless the authorization is  terminated or revoked. Performed at [facility], [address] [place], [place] [address]      Labs: BNP (last 3 results) No results for input(s): BNP in the last 8760 hours. Basic Metabolic Panel: Recent Labs  Lab 09/10/20 1306 09/10/20 1315 09/12/20 0320 09/12/20 1202 09/13/20 0104  NA 138 139 139 139 139  K 3.7 3.7 3.2* 3.4* 3.3*  CL 106 103 108 107 107  CO2 23  --  GLUCOSE 120* 117* 138* 186* 98  BUN 13 16 28* 24* 17  CREATININE 0.96 0.80 2.88* 2.06* 1.36*   CALCIUM 8.8*  --  7.8* 7.8* 7.5*  MG  --   --  1.6*  --  1.6*   Liver Function Tests: Recent Labs  Lab 09/10/20 1306  AST 23  ALT 16  ALKPHOS 130*  BILITOT 1.1  PROT 7.7  ALBUMIN 3.7   No results for input(s): LIPASE, AMYLASE in the last 168 hours. No results for input(s): AMMONIA in the last 168 hours. CBC: Recent Labs  Lab 09/10/20 1306 09/10/20 1315 09/12/20 0320 09/13/20 0104  WBC 6.2  --  11.7* 10.1  NEUTROABS 3.7  --  8.9* 6.3  HGB 11.9* 13.6 11.4* 10.7*  HCT 38.7* 40.0 35.0* 32.6*  MCV 98.2  --  93.1 94.8  PLT 306  --  294 283   Cardiac Enzymes: No results for input(s): CKTOTAL, CKMB, CKMBINDEX, TROPONINI in the last 168 hours. BNP: Invalid input(s): POCBNP CBG: Recent Labs  Lab 09/12/20 1644 09/12/20 2102 09/13/20 0638 09/13/20 1253 09/13/20 1545  GLUCAP 121* 110* 104* 149* 109*   D-Dimer No results for input(s): DDIMER in the last 72 hours. Hgb A1c Recent Labs    09/11/20 0319  HGBA1C 6.0*   Lipid Profile Recent Labs    09/11/20 0319  CHOL 137  HDL 55  LDLCALC 75  TRIG 35  CHOLHDL 2.5   Thyroid function studies Recent Labs    09/10/20 2108  TSH 5.556*   Anemia work up No results for input(s): VITAMINB12, FOLATE, FERRITIN, TIBC, IRON, RETICCTPCT in the last 72 hours. Urinalysis    Component Value Date/Time   COLORURINE STRAW (A) 09/10/2020 1306   APPEARANCEUR CLEAR 09/10/2020 1306   LABSPEC 1.004 (L) 09/10/2020 1306   PHURINE 7.0 09/10/2020 1306   GLUCOSEU NEGATIVE 09/10/2020 1306   HGBUR LARGE (A) 09/10/2020 1306   BILIRUBINUR NEGATIVE 09/10/2020 1306   KETONESUR NEGATIVE 09/10/2020 1306   PROTEINUR NEGATIVE 09/10/2020 1306   NITRITE NEGATIVE 09/10/2020 1306   LEUKOCYTESUR NEGATIVE 09/10/2020 1306   Sepsis Labs Invalid input(s): PROCALCITONIN,  WBC,  LACTICIDVEN Microbiology Recent Results (from the past 240 hour(s))  Respiratory Panel by RT PCR (Flu A&B, Covid) - Nasopharyngeal Swab     Status: None   Collection  Time:  09/10/20  1:07 PM   Specimen: Nasopharyngeal Swab  Result Value Ref Range Status   SARS Coronavirus 2 by RT PCR NEGATIVE NEGATIVE Final    Comment: (NOTE) SARS-CoV-2 target nucleic acids are NOT DETECTED.  The SARS-CoV-2 RNA is generally detectable in upper respiratoy specimens during the acute phase of infection. The lowest concentration of SARS-CoV-2 viral copies this assay can detect is 131 copies/mL. A negative result does not preclude SARS-Cov-2 infection and should not be used as the sole basis for treatment or other patient management decisions. A negative result may occur with  improper specimen collection/handling, submission of specimen other than nasopharyngeal swab, presence of viral mutation(s) within the areas targeted by this assay, and inadequate number of viral copies (<131 copies/mL). A negative result must be combined with clinical observations, patient history, and epidemiological information. The expected result is Negative.  Fact Sheet for Patients:  https://www.moore.com/  Fact Sheet for Healthcare Providers:  https://www.young.biz/  This test is no t yet approved or cleared by the [place] FDA and  has been authorized for detection and/or diagnosis of SARS-CoV-2 by FDA under an Emergency Use Authorization (EUA). This EUA will remain  in effect (meaning this test can be used) for the duration of the COVID-19 declaration under Section 564(b)(1) of the Act, 21 U.S.C. section 360bbb-3(b)(1), unless the authorization is terminated or revoked sooner.     Influenza A by PCR NEGATIVE NEGATIVE Final   Influenza B by PCR NEGATIVE NEGATIVE Final    Comment: (NOTE) The Xpert Xpress SARS-CoV-2/FLU/RSV assay is intended as an aid in  the diagnosis of influenza from Nasopharyngeal swab specimens and  should not be used as a sole basis for treatment. Nasal washings and  aspirates are unacceptable for Xpert Xpress  SARS-CoV-2/FLU/RSV  testing.  Fact Sheet for Patients: https://www.moore.com/  Fact Sheet for Healthcare Providers: https://www.young.biz/  This test is not yet approved or cleared by the [place] FDA and  has been authorized for detection and/or diagnosis of SARS-CoV-2 by  FDA under an Emergency Use Authorization (EUA). This EUA will remain  in effect (meaning this test can be used) for the duration of the  Covid-19 declaration under Section 564(b)(1) of the Act, 21  U.S.C. section 360bbb-3(b)(1), unless the authorization is  terminated or revoked. Performed at [facility], [address] [place], [place] [address]     Procedures/Studies: MR ANGIO HEAD WO CONTRAST  Result Date: 09/10/2020 CLINICAL DATA:  86 year old male with atrial fibrillation on Eliquis. Code stroke presentation earlier today. Left facial droop and slurred speech. EXAM: MRI HEAD WITHOUT CONTRAST MRA HEAD WITHOUT CONTRAST TECHNIQUE: Multiplanar, multiecho pulse sequences of the brain and surrounding structures were obtained without intravenous contrast. Angiographic images of the head were obtained using MRA technique without contrast. COMPARISON:  Plain head CT 1312 hours today. FINDINGS: MRI HEAD FINDINGS Brain: Multifocal patchy right MCA territory infarct affecting the lateral right motor strip and anterior right operculum. See series 4, image 34, series 6, image 18). No contralateral or other restricted diffusion. Minimal T2 and FLAIR hyperintensity at the affected parenchyma. No hemorrhage or mass effect. No chronic cortical encephalomalacia identified. Mild for age patchy bilateral white matter T2 and FLAIR hyperintensity. No midline shift, mass effect, evidence of mass lesion, ventriculomegaly, extra-axial collection or acute intracranial hemorrhage. Cervicomedullary junction and pituitary are within normal limits. Vascular: Major intracranial vascular flow voids  are preserved. Skull and upper cervical spine: Negative for age visible cervical spine. Visualized bone marrow signal  is within normal limits. Sinuses/Orbits: Postoperative changes to both globes. Otherwise negative orbits. Paranasal sinuses and mastoids are stable and well pneumatized. Other: Grossly normal visible internal auditory structures. Negative visible scalp and face. MRA HEAD FINDINGS Antegrade flow in the posterior circulation with fairly codominant distal vertebral arteries. No distal vertebral stenosis. Patent left PICA origin, vertebrobasilar junction, dominant appearing right AICA. Patent basilar artery. Patent SCA and PCA origins. Posterior communicating arteries are diminutive or absent. Bilateral PCA branches are within normal limits. Antegrade flow in both ICA siphons. No siphon stenosis. Patent carotid termini. Normal MCA and ACA origins. Diminutive or absent anterior communicating artery. MCA M1 segments and both MCA bifurcations are patent without stenosis. On the right no MCA branch occlusion is identified. Visible left MCA and bilateral ACA branches are within normal limits. IMPRESSION: 1. Patchy acute Right MCA territory infarcts, including at the lateral right motor strip. No associated hemorrhage or mass effect. 2. No Right MCA branch occlusion identified, intracranial MRA is negative for age. Electronically Signed   By: Odessa Fleming M.D.   On: 09/10/2020 18:40   MR BRAIN WO CONTRAST  Result Date: 09/10/2020 CLINICAL DATA:  86 year old male with atrial fibrillation on Eliquis. Code stroke presentation earlier today. Left facial droop and slurred speech. EXAM: MRI HEAD WITHOUT CONTRAST MRA HEAD WITHOUT CONTRAST TECHNIQUE: Multiplanar, multiecho pulse sequences of the brain and surrounding structures were obtained without intravenous contrast. Angiographic images of the head were obtained using MRA technique without contrast. COMPARISON:  Plain head CT 1312 hours today. FINDINGS: MRI HEAD  FINDINGS Brain: Multifocal patchy right MCA territory infarct affecting the lateral right motor strip and anterior right operculum. See series 4, image 34, series 6, image 18). No contralateral or other restricted diffusion. Minimal T2 and FLAIR hyperintensity at the affected parenchyma. No hemorrhage or mass effect. No chronic cortical encephalomalacia identified. Mild for age patchy bilateral white matter T2 and FLAIR hyperintensity. No midline shift, mass effect, evidence of mass lesion, ventriculomegaly, extra-axial collection or acute intracranial hemorrhage. Cervicomedullary junction and pituitary are within normal limits. Vascular: Major intracranial vascular flow voids are preserved. Skull and upper cervical spine: Negative for age visible cervical spine. Visualized bone marrow signal is within normal limits. Sinuses/Orbits: Postoperative changes to both globes. Otherwise negative orbits. Paranasal sinuses and mastoids are stable and well pneumatized. Other: Grossly normal visible internal auditory structures. Negative visible scalp and face. MRA HEAD FINDINGS Antegrade flow in the posterior circulation with fairly codominant distal vertebral arteries. No distal vertebral stenosis. Patent left PICA origin, vertebrobasilar junction, dominant appearing right AICA. Patent basilar artery. Patent SCA and PCA origins. Posterior communicating arteries are diminutive or absent. Bilateral PCA branches are within normal limits. Antegrade flow in both ICA siphons. No siphon stenosis. Patent carotid termini. Normal MCA and ACA origins. Diminutive or absent anterior communicating artery. MCA M1 segments and both MCA bifurcations are patent without stenosis. On the right no MCA branch occlusion is identified. Visible left MCA and bilateral ACA branches are within normal limits. IMPRESSION: 1. Patchy acute Right MCA territory infarcts, including at the lateral right motor strip. No associated hemorrhage or mass effect. 2.  No Right MCA branch occlusion identified, intracranial MRA is negative for age. Electronically Signed   By: Odessa Fleming M.D.   On: 09/10/2020 18:40   ECHOCARDIOGRAM COMPLETE  Result Date: 09/10/2020    ECHOCARDIOGRAM REPORT   Patient Name:   ALTER MOSS Date of Exam: 09/10/2020 Medical Rec #:  [id number]     Height:  70.0 in Accession #:    [id number]    Weight:       180.0 lb Date of Birth:  [birth year]     BSA:          1.996 m Patient Age:    86 years      BP:           154/86 mmHg Patient Gender: M             HR:           107 bpm. Exam Location:  Inpatient Procedure: 2D Echo Indications:    TIA 435.9  History:        Patient has no prior history of Echocardiogram examinations.                 Arrythmias:Atrial Fibrillation; Risk Factors:Diabetes,                 Dyslipidemia and Hypertension.  Sonographer:    Celene Skeen RDCS (AE) Referring Phys: [id number] JENNIFER YATES  Sonographer Comments: patient was very agreeable. however, at times had difficulty following breathing instructions in regards to holding breath. IMPRESSIONS  1. Left ventricular ejection fraction, by estimation, is 60 to 65%. The left ventricle has normal function. The left ventricle has no regional wall motion abnormalities. Left ventricular diastolic parameters are indeterminate.  2. Right ventricular systolic function is normal. The right ventricular size is normal. There is moderately elevated pulmonary artery systolic pressure. The estimated right ventricular systolic pressure is 55.4 mmHg.  3. Left atrial size was mildly dilated.  4. Right atrial size was mildly dilated.  5. The mitral valve is normal in structure. Trivial mitral valve regurgitation. No evidence of mitral stenosis.  6. The aortic valve is tricuspid. Aortic valve regurgitation is not visualized. Mild aortic valve sclerosis is present, with no evidence of aortic valve stenosis.  7. The inferior vena cava is dilated in size with <50% respiratory variability, suggesting  right atrial pressure of 15 mmHg.  8. The patient was in atrial fibrillation. FINDINGS  Left Ventricle: Left ventricular ejection fraction, by estimation, is 60 to 65%. The left ventricle has normal function. The left ventricle has no regional wall motion abnormalities. The left ventricular internal cavity size was normal in size. There is  no left ventricular hypertrophy. Left ventricular diastolic parameters are indeterminate. Right Ventricle: The right ventricular size is normal. No increase in right ventricular wall thickness. Right ventricular systolic function is normal. There is moderately elevated pulmonary artery systolic pressure. The tricuspid regurgitant velocity is 3.18 m/s, and with an assumed right atrial pressure of 15 mmHg, the estimated right ventricular systolic pressure is 55.4 mmHg. Left Atrium: Left atrial size was mildly dilated. Right Atrium: Right atrial size was mildly dilated. Pericardium: There is no evidence of pericardial effusion. Mitral Valve: The mitral valve is normal in structure. Mild mitral annular calcification. Trivial mitral valve regurgitation. No evidence of mitral valve stenosis. Tricuspid Valve: The tricuspid valve is normal in structure. Tricuspid valve regurgitation is trivial. Aortic Valve: The aortic valve is tricuspid. Aortic valve regurgitation is not visualized. Mild aortic valve sclerosis is present, with no evidence of aortic valve stenosis. Pulmonic Valve: The pulmonic valve was normal in structure. Pulmonic valve regurgitation is not visualized. Aorta: The aortic root is normal in size and structure. Venous: The inferior vena cava is dilated in size with less than 50% respiratory variability, suggesting right atrial pressure of 15 mmHg. IAS/Shunts: No atrial level shunt detected by  color flow Doppler.  LEFT VENTRICLE PLAX 2D LVIDd:         4.90 cm LVIDs:         2.50 cm LV PW:         0.80 cm LV IVS:        0.90 cm LVOT diam:     2.30 cm LV SV:         64 LV SV  Index:   32 LVOT Area:     4.15 cm  LEFT ATRIUM           Index LA diam:      5.10 cm 2.56 cm/m LA Vol (A2C): 47.7 ml 23.90 ml/m  AORTIC VALVE LVOT Vmax:   75.80 cm/s LVOT Vmean:  52.600 cm/s LVOT VTI:    0.155 m  AORTA Ao Root diam: 3.10 cm MITRAL VALVE                TRICUSPID VALVE MV Area (PHT): 6.96 cm     TR Peak grad:   40.4 mmHg MV Decel Time: 109 msec     TR Vmax:        318.00 cm/s MV E velocity: 104.00 cm/s                             SHUNTS                             Systemic VTI:  0.16 m                             Systemic Diam: 2.30 cm Marca Anconaalton Mclean MD Electronically signed by Marca Anconaalton Mclean MD Signature Date/Time: 09/10/2020/4:30:53 PM    Final    CT HEAD CODE STROKE WO CONTRAST  Result Date: 09/10/2020 CLINICAL DATA:  Code stroke.  Left facial droop and slurred speech. EXAM: CT HEAD WITHOUT CONTRAST TECHNIQUE: Contiguous axial images were obtained from the base of the skull through the vertex without intravenous contrast. COMPARISON:  None. FINDINGS: Brain: There is no evidence of an acute infarct, intracranial hemorrhage, mass, midline shift, or extra-axial fluid collection. The ventricles and sulci are within normal limits for age. Hypodensities in the cerebral white matter bilaterally are nonspecific but compatible with mild chronic small vessel ischemic disease. Vascular: Calcified atherosclerosis at the skull base. No hyperdense vessel. Skull: No fracture or suspicious osseous lesion. Sinuses/Orbits: Minimal mucosal thickening in the left maxillary sinus. Clear mastoid air cells. Bilateral cataract extraction. Other: None. ASPECTS [facility]([place] Stroke Program Early CT Score) - Ganglionic level infarction (caudate, lentiform nuclei, internal capsule, insula, M1-M3 cortex): 7 - Supraganglionic infarction (M4-M6 cortex): 3 Total score (0-10 with 10 being normal): 10 IMPRESSION: 1. No evidence of acute intracranial abnormality. 2. ASPECTS is 10. 3. Mild chronic small vessel ischemic disease.  These results were communicated to Dr. Pearlean BrownieSethi at 1:27 pm on 09/10/2020 by text page via the [facility] messaging system. Electronically Signed   By: Sebastian AcheAllen  Grady M.D.   On: 09/10/2020 13:27   [facility]ID [facility])  Result Date: 09/12/2020 Carotid Arterial Duplex Study Indications:       TIA. Risk Factors:      Hypertension, Diabetes. Comparison Study:  no prior Performing Technologist: Blanch MediaMegan Riddle RVS  Examination Guidelines: A complete evaluation includes B-mode imaging, spectral Doppler, color Doppler, and power Doppler  as needed of all accessible portions of each vessel. Bilateral testing is considered an integral part of a complete examination. Limited examinations for reoccurring indications may be performed as noted.  Right Carotid Findings: +----------+--------+--------+--------+------------------+--------+           PSV cm/sEDV cm/sStenosisPlaque DescriptionComments +----------+--------+--------+--------+------------------+--------+ CCA Prox  74                      heterogenous               +----------+--------+--------+--------+------------------+--------+ CCA Distal79      13              heterogenous               +----------+--------+--------+--------+------------------+--------+ ICA Prox  42      9       1-39%   heterogenous               +----------+--------+--------+--------+------------------+--------+ ICA Distal82      18                                         +----------+--------+--------+--------+------------------+--------+ ECA       76                                                 +----------+--------+--------+--------+------------------+--------+ +----------+--------+-------+--------+-------------------+           PSV cm/sEDV cmsDescribeArm Pressure (mmHG) +----------+--------+-------+--------+-------------------+ [id number]                                         +----------+--------+-------+--------+-------------------+  +---------+--------+--+--------+-+ VertebralPSV cm/s37EDV cm/s9 +---------+--------+--+--------+-+  Left Carotid Findings: +----------+--------+--------+--------+------------------+--------+           PSV cm/sEDV cm/sStenosisPlaque DescriptionComments +----------+--------+--------+--------+------------------+--------+ CCA Prox  83      11              heterogenous               +----------+--------+--------+--------+------------------+--------+ CCA Distal62      9               heterogenous               +----------+--------+--------+--------+------------------+--------+ ICA Prox  27      9       1-39%   heterogenous               +----------+--------+--------+--------+------------------+--------+ ICA Distal69      12                                         +----------+--------+--------+--------+------------------+--------+ ECA       63                                                 +----------+--------+--------+--------+------------------+--------+ +----------+--------+--------+--------+-------------------+           PSV cm/sEDV cm/sDescribeArm Pressure (mmHG) +----------+--------+--------+--------+-------------------+ [id number]                                          +----------+--------+--------+--------+-------------------+ +---------+--------+--+--------+-+---------+  VertebralPSV cm/s29EDV cm/s9Antegrade +---------+--------+--+--------+-+---------+   Summary: Right Carotid: Velocities in the right ICA are consistent with a 1-39% stenosis. Left Carotid: Velocities in the left ICA are consistent with a 1-39% stenosis. Vertebrals: Bilateral vertebral arteries demonstrate antegrade flow. *See table(s) above for measurements and observations.  Electronically signed by Fabienne Bruns MD on 09/12/2020 at 11:47:26 AM.    Final      Time coordinating discharge: Over 30 minutes    Lewie Chamber, MD  [facility] 09/13/2020, 5:16  PM

## 2020-09-14 ENCOUNTER — Encounter (HOSPITAL_COMMUNITY): Payer: Self-pay

## 2020-09-19 ENCOUNTER — Emergency Department (HOSPITAL_COMMUNITY)
Admission: EM | Admit: 2020-09-19 | Discharge: 2020-09-19 | Disposition: A | Discharge status: Home / Self Care | Payer: Medicare Other | Attending: Emergency Medicine | Admitting: Emergency Medicine

## 2020-09-19 ENCOUNTER — Other Ambulatory Visit: Payer: Self-pay

## 2020-09-19 ENCOUNTER — Encounter (HOSPITAL_COMMUNITY): Payer: Self-pay | Admitting: *Deleted

## 2020-09-19 DIAGNOSIS — T839XXA Unspecified complication of genitourinary prosthetic device, implant and graft, initial encounter: Secondary | ICD-10-CM

## 2020-09-19 DIAGNOSIS — I10 Essential (primary) hypertension: Secondary | ICD-10-CM | POA: Insufficient documentation

## 2020-09-19 DIAGNOSIS — T83091A Other mechanical complication of indwelling urethral catheter, initial encounter: Secondary | ICD-10-CM | POA: Diagnosis not present

## 2020-09-19 DIAGNOSIS — Z79899 Other long term (current) drug therapy: Secondary | ICD-10-CM | POA: Diagnosis not present

## 2020-09-19 DIAGNOSIS — E1149 Type 2 diabetes mellitus with other diabetic neurological complication: Secondary | ICD-10-CM | POA: Insufficient documentation

## 2020-09-19 DIAGNOSIS — N139 Obstructive and reflux uropathy, unspecified: Secondary | ICD-10-CM | POA: Insufficient documentation

## 2020-09-19 DIAGNOSIS — Z7984 Long term (current) use of oral hypoglycemic drugs: Secondary | ICD-10-CM | POA: Diagnosis not present

## 2020-09-19 DIAGNOSIS — Z7901 Long term (current) use of anticoagulants: Secondary | ICD-10-CM | POA: Insufficient documentation

## 2020-09-19 LAB — URINALYSIS, ROUTINE W REFLEX MICROSCOPIC
Bilirubin Urine: NEGATIVE
Glucose, UA: NEGATIVE mg/dL
Ketones, ur: NEGATIVE mg/dL
Nitrite: NEGATIVE
Protein, ur: 100 mg/dL — AB
RBC / HPF: >50 RBC/hpf — ABNORMAL HIGH (ref 0–5)
Specific Gravity, Urine: 1.008 (ref 1.005–1.030)
pH: 6 (ref 5.0–8.0)

## 2020-09-19 MED ORDER — CEPHALEXIN 500 MG PO CAPS: 500.0000 mg | ORAL_CAPSULE | Freq: Four times a day (QID) | ORAL | 0 refills | Status: AC

## 2020-09-19 NOTE — ED Provider Notes (Signed)
[facility] EMERGENCY DEPARTMENT Provider Note   CSN: [id number] Arrival date & time: 09/19/20  6269     History Chief Complaint  Patient presents with  . Urinary Tract Infection    Jason Hunter is a 86 y.o. male.  Recent urinary catheterization due to retention after acute stroke.  Catheter stopped flowing overnight, minimal red-yellow urine produced in the last several hours.  He has lower abdominal pressure but no significant abdominal pain is tolerating p.o.  No fevers chills.  No other sick symptoms.  No new trauma.        Past Medical History:  Diagnosis Date  . Atrial fibrillation (HCC)   . Diabetes mellitus without complication (HCC)   . Diabetes with neurologic complications (HCC)   . Dyslipidemia   . Essential hypertension   . Hypertension     Patient Active Problem List   Diagnosis Date Noted  . Acute urinary retention 09/12/2020  . CVA (cerebral vascular accident) (HCC) 09/10/2020  . Atrial fibrillation (HCC)   . Diabetes with neurologic complications (HCC)   . Dyslipidemia   . Essential hypertension     History reviewed. No pertinent surgical history.     No family history on file.  Social History   Tobacco Use  . Smoking status: Never Smoker  . Smokeless tobacco: Never Used  Substance Use Topics  . Alcohol use: Never  . Drug use: Never    Home Medications Prior to Admission medications   Medication Sig Start Date End Date Taking? Authorizing Provider  amLODipine (NORVASC) 10 MG tablet Take 10 mg by mouth daily. 08/18/20   [provider]  atorvastatin (LIPITOR) 10 MG tablet Take 1 tablet (10 mg total) by mouth daily. 09/14/20   Lewie Chamber, MD  carvedilol (COREG) 25 MG tablet Take 25 mg by mouth 2 (two) times daily. 06/19/16   [provider]  carvedilol (COREG) 25 MG tablet Take 25 mg by mouth 2 (two) times daily. 08/29/20   [provider]  cephALEXin (KEFLEX) 500 MG capsule Take 1  capsule (500 mg total) by mouth 4 (four) times daily for 5 days. 09/19/20 09/24/20  Sabino Donovan, MD  chlorthalidone (HYGROTON) 25 MG tablet Take 25 mg by mouth every morning. 06/06/16   [provider]  ELIQUIS 5 MG TABS tablet Take 5 mg by mouth 2 (two) times daily. 07/24/20   [provider]  felodipine (PLENDIL) 10 MG 24 hr tablet Take 10 mg by mouth daily. 06/19/16   [provider]  glipiZIDE (GLUCOTROL XL) 5 MG 24 hr tablet Take 5 mg by mouth daily. 06/14/16   [provider]  metFORMIN (GLUCOPHAGE-XR) 500 MG 24 hr tablet Take 500 mg by mouth 2 (two) times daily. 06/20/20   [provider]  potassium chloride SA (K-DUR,KLOR-CON) 20 MEQ tablet Take 20 mEq by mouth daily. Take 1 tablet twice daily for 3 days, then continue 1 tablet daily, thereafter 07/05/16   [provider]  pravastatin (PRAVACHOL) 20 MG tablet Take 20 mg by mouth every morning. 06/25/16   [provider]  rivaroxaban (XARELTO) 20 MG TABS tablet Take 20 mg by mouth daily.    [provider]  telmisartan (MICARDIS) 80 MG tablet Take 80 mg by mouth daily. 06/07/16   [provider]  telmisartan (MICARDIS) 80 MG tablet Take 80 mg by mouth daily. 08/24/20   [provider]    Allergies    Patient has no known allergies.  Review of Systems   Review of Systems  Constitutional: Negative for chills and fever.  HENT: Negative for congestion and rhinorrhea.   Respiratory: Negative for cough and shortness of breath.   Cardiovascular: Negative for chest pain and palpitations.  Gastrointestinal: Negative for diarrhea, nausea and vomiting.  Genitourinary: Positive for difficulty urinating and hematuria. Negative for dysuria.  Musculoskeletal: Negative for arthralgias and back pain.  Skin: Negative for color change and rash.  Neurological: Negative for light-headedness and headaches.    Physical Exam Updated Vital Signs BP 134/74   Pulse 94   Temp  98.6 F (37 C) (Oral)   Resp 16   Ht 5\' 11"  (1.803 m)   Wt 81.6 kg   SpO2 98%   BMI 25.10 kg/m   Physical Exam Vitals and nursing note reviewed.  Constitutional:      General: He is not in acute distress.    Appearance: Normal appearance.  HENT:     Head: Normocephalic and atraumatic.     Nose: No rhinorrhea.  Eyes:     General:        Right eye: No discharge.        Left eye: No discharge.     Conjunctiva/sclera: Conjunctivae normal.  Cardiovascular:     Rate and Rhythm: Normal rate and regular rhythm.  Pulmonary:     Effort: Pulmonary effort is normal.     Breath sounds: No stridor.  Abdominal:     General: Abdomen is flat. There is no distension.     Palpations: Abdomen is soft.     Tenderness: There is abdominal tenderness (mild suprapubic).  Genitourinary:    Comments: Foley cath in place,  Musculoskeletal:        General: No deformity or signs of injury.  Skin:    General: Skin is warm and dry.  Neurological:     General: No focal deficit present.     Mental Status: He is alert. Mental status is at baseline.     Motor: No weakness.  Psychiatric:        Mood and Affect: Mood normal.        Behavior: Behavior normal.        Thought Content: Thought content normal.     ED Results / Procedures / Treatments   Labs (all labs ordered are listed, but only abnormal results are displayed) Labs Reviewed  URINALYSIS, ROUTINE W REFLEX MICROSCOPIC - Abnormal; Notable for the following components:      Result Value   Color, Urine AMBER (*)    APPearance HAZY (*)    Hgb urine dipstick MODERATE (*)    Protein, ur 100 (*)    Leukocytes,Ua SMALL (*)    RBC / HPF >50 (*)    Bacteria, UA FEW (*)    All other components within normal limits  URINE CULTURE    EKG None  Radiology No results found.  Procedures Procedures (including critical care time)  Medications Ordered in ED Medications - No data to display  ED Course  I have reviewed the triage vital  signs and the nursing notes.  Pertinent labs & imaging results that were available during my care of the patient were reviewed by me and considered in my medical decision making (see chart for details).    MDM Rules/Calculators/A&P                          Recent urinary retention, required cystoscopy with  wire-guided Foley catheter placement discharge home now has retention.  Bedside nursing removed old Foley placed new Foley flushed remove a large clot burden and multiple 100 cc of urine drained.  Patient is much more comfortable.  Will send urine for infectious studies, otherwise no labs needed patient overall well-appearing.  Patient's urinalysis shows leukocyte esterase and few bacteria, possible concern for infection especially with indwelling catheter.  Will put on Keflex and follow-up with urology.  Strict return precautions provided Foley care instructions provided patient and family agree with the plan.  Final Clinical Impression(s) / ED Diagnoses Final diagnoses:  Urinary obstruction  Complication of Foley catheter, initial encounter [facility])    Rx / DC Orders ED Discharge Orders         Ordered    cephALEXin (KEFLEX) 500 MG capsule  4 times daily        09/19/20 0908           Sabino Donovan, MD 09/19/20 [phone]

## 2020-09-19 NOTE — ED Triage Notes (Signed)
States he had a urinary stent place on thurs and is suppose to have removed on tues, states it stared hurting last pm and now he isnt able to urinate

## 2020-09-20 LAB — URINE CULTURE: Culture: NO GROWTH

## 2020-10-13 ENCOUNTER — Inpatient Hospital Stay: Payer: Medicare Other | Admitting: Adult Health

## 2020-10-15 ENCOUNTER — Encounter: Payer: Self-pay | Admitting: Adult Health

## 2020-10-15 ENCOUNTER — Ambulatory Visit (INDEPENDENT_AMBULATORY_CARE_PROVIDER_SITE_OTHER): Payer: Medicare Other | Admitting: Adult Health

## 2020-10-15 VITALS — BP 131/73 | HR 96 | Ht 71.0 in | Wt 170.0 lb

## 2020-10-15 DIAGNOSIS — I4811 Longstanding persistent atrial fibrillation: Secondary | ICD-10-CM

## 2020-10-15 DIAGNOSIS — R339 Retention of urine, unspecified: Secondary | ICD-10-CM

## 2020-10-15 DIAGNOSIS — E785 Hyperlipidemia, unspecified: Secondary | ICD-10-CM | POA: Diagnosis not present

## 2020-10-15 DIAGNOSIS — I63511 Cerebral infarction due to unspecified occlusion or stenosis of right middle cerebral artery: Secondary | ICD-10-CM | POA: Diagnosis not present

## 2020-10-15 DIAGNOSIS — I10 Essential (primary) hypertension: Secondary | ICD-10-CM | POA: Diagnosis not present

## 2020-10-15 NOTE — Patient Instructions (Addendum)
Continue Eliquis (apixaban) daily  and atorvastatin 10 mg daily for secondary stroke prevention  Continue to follow with urology for urinary retention and Foley catheter management  Continue to follow up with PCP regarding cholesterol, blood pressure and diabetes management  Maintain strict control of hypertension with blood pressure goal below 130/90, diabetes with hemoglobin A1c goal below 7.0 % and cholesterol with LDL cholesterol (bad cholesterol) goal below 70 mg/dL.      Followup in the future with me in 3 months or call earlier if needed      Thank you for coming to see [place] at [facility]. I hope we have been able to provide you high quality care today.  You may receive a patient satisfaction survey over the next few weeks. We would appreciate your feedback and comments so that we may continue to improve ourselves and the health of our patients.

## 2020-10-15 NOTE — Progress Notes (Signed)
[facility] [address] [place]. [place] [address] [phone]       HOSPITAL FOLLOW UP NOTE  Mr. CEASER EBELING Date of Birth:  1928-04-05 Medical Record Number:  [id number]   Reason for Referral:  hospital stroke follow up    SUBJECTIVE:   CHIEF COMPLAINT:  Chief Complaint  Patient presents with  . Hospitalization Follow-up    Rm 9, alone, pt states he is doing well,     HPI:   JAYZEN PAVER is a 86 year old African-American male with PMHx significant of HTN, HLD, A. fib on Eliquis and DM with sudden onset of slurred speech and left facial droop on 09/10/2020.  Personally reviewed hospitalization pertinent progress notes, lab work and imaging with summary provided.  Evaluated by Dr. Pearlean Brownie with stroke work-up revealing acute right MCA territory infarcts secondary to AF on Eliquis although reported some noncompliance due to occasional bloody penile discharge. recommended continuation of Eliquis for secondary stroke prevention and history of A. fib.  LDL 75 and initiated atorvastatin 10 mg daily.  Controlled DM with A1c 6.0.  HTN stable.  On 09/11/2020, developed urinary retention with unsuccessful Foley catheter placements with eventual placement by urology requiring intermittent irrigation and advised follow-up outpatient.  Evaluated by therapy with residual imbalance and cognitive impairment with decreased awareness of deficits and recommended discharge home with home health therapies and 24/7 supervision.  Stroke:   Patchy R MCA territory infarct embolic secondary to AF on Eliquis but not compliant d/t recent small specks of blood in stool  Code Stroke CT head No acute abnormality. Small vessel disease. ASPECTS 10.     MRI  Patchy R MCA territory infarcts.  MRA  Unremarkable   Carotid Doppler  B ICA 1-39% stenosis, VAs antegrade   2D Echo EF 60-65%. LA mildly dilated. Pt in AF  LDL 75  HgbA1c 6.0  VTE prophylaxis - Eliquis  Eliquis (apixaban)  daily prior to admission but had missed some doses, now on aspirin 325 mg daily and Eliquis (apixaban) daily. No indication for additional aspirin. Will stop. Continue eliquis alone without missing doses.    Therapy recommendations:  HH PT, RW  Disposition:  Return home  Today, 10/15/2020, Mr. Podolak is being seen for hospital follow-up unaccompanied.  He has been doing well since returning home.  He does report some speech difficulty and occasional imbalance but this has continued to improve.  Previously working for the state driving dump trucks and he does not plan on returning.  He lives in his own home but granddaughter lives with him. He does drive short distance without difficulty. Denies new or worsening stroke/TIA symptoms.  Remains on Eliquis without bleeding or bruising.  Remains on atorvastatin 10 mg daily without myalgias.  Blood pressure today 131/73.  He was evaluated in the ED on 11/20 and diagnosed with UTI with indwelling catheter placed on Keflex and advised follow-up needed with urology. He has been following with urology with foley catheter in place and denies any recent evidence of hematuria.  No concerns at this time.    ROS:   14 system review of systems performed and negative with exception of those listed in HPI  PMH:  Past Medical History:  Diagnosis Date  . Atrial fibrillation (HCC)   . Diabetes mellitus without complication (HCC)   . Diabetes with neurologic complications (HCC)   . Dyslipidemia   . Essential hypertension   . Hypertension     PSH: No past surgical history on  file.  Social History:  Social History   Socioeconomic History  . Marital status: Widowed    Spouse name: Not on file  . Number of children: Not on file  . Years of education: Not on file  . Highest education level: Not on file  Occupational History  . 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 Smoking status: Never Smoker  . Smokeless tobacco: Never Used  Substance and  Sexual Activity  . Alcohol use: Never  . Drug use: Never  . Sexual activity: Not on file  Other Topics Concern  . Not on file  Social History Narrative   ** Merged History Encounter **       Social Determinants of Health   Financial Resource Strain: Not on file  Food Insecurity: Not on file  Transportation Needs: Not on file  Physical Activity: Not on file  Stress: Not on file  Social Connections: Not on file  Intimate Partner Violence: Not on file    Family History: No family history on file.  Medications:   Current Outpatient Medications on File Prior to Visit  Medication Sig Dispense Refill  . amLODipine (NORVASC) 10 MG tablet Take 10 mg by mouth daily.    . carvedilol (COREG) 25 MG tablet Take 25 mg by mouth 2 (two) times daily.    Marland Kitchen ELIQUIS 5 MG TABS tablet Take 5 mg by mouth 2 (two) times daily.    . metFORMIN (GLUCOPHAGE-XR) 500 MG 24 hr tablet Take 500 mg by mouth 2 (two) times daily.    Marland Kitchen telmisartan (MICARDIS) 80 MG tablet Take 80 mg by mouth daily.  0   No current facility-administered medications on file prior to visit.    Allergies:  No Known Allergies    OBJECTIVE:  Physical Exam  Vitals:   10/15/20 1039  BP: 131/73  Pulse: 96  Weight: 170 lb (77.1 kg)  Height: 5\' 11"  (1.803 m)   Body mass index is 23.71 kg/m. No exam data present  General: well developed, well nourished,  very pleasant elderly African-American male, seated, in no evident distress Head: head normocephalic and atraumatic.   Neck: supple with no carotid or supraclavicular bruits Cardiovascular: irregular rate and rhythm, no murmurs Musculoskeletal: no deformity Skin:  no rash/petichiae Vascular:  Normal pulses all extremities   Neurologic Exam Mental Status: Awake and fully alert.   Mild dysarthria.  Oriented to place and time. Recent memory mildly subjectively impaired and remote memory intact. Attention span, concentration and fund of knowledge appropriate. Mood and affect  appropriate.  Cranial Nerves: Fundoscopic exam reveals sharp disc margins. Pupils equal, briskly reactive to light. Extraocular movements full without nystagmus. Visual fields full to confrontation. Hearing intact. Facial sensation intact.  Left lower facial weakness.  Tongue and palate moves normally and symmetrically.  Motor: Normal bulk and tone. Normal strength in all tested extremities Sensory.: intact to touch , pinprick , position and vibratory sensation.  Coordination: Rapid alternating movements normal in all extremities. Finger-to-nose and heel-to-shin performed accurately bilaterally. Gait and Station: Arises from chair without difficulty. Stance is normal. Gait demonstrates normal stride length and mild imbalance without use of assistive device. Reflexes: 1+ and symmetric. Toes downgoing.     NIHSS  2 Modified Rankin  2      ASSESSMENT: Jason Hunter is a 86 y.o. year old male presented with left-sided weakness and facial droop on 09/10/2020 with stroke work-up revealing patchy right MCA territory infarcts, embolic secondary to known AF on  Eliquis but noncompliant due to occasional bloody urine. Vascular risk factors include A. fib, HTN, HLD, DM and advanced age.  Hospital course complicated by urinary retention with traumatic Foley attempt.  Foley catheter remains in place monitored by urology.     PLAN:  1. R MCA stroke :  a. Residual deficit: Mild dysarthria, left lower facial weakness and mild imbalance.  Not currently working with therapy and denies interest at this time.  He will call office if he wishes to pursue in the future b. Continue Eliquis (apixaban) daily  and atorvastatin 10 mg daily for secondary stroke prevention.   c. Discussed secondary stroke prevention measures and importance of close PCP follow up for aggressive stroke risk factor management  2. A. Fib: Endorses ongoing compliance with Eliquis 5 mg twice daily without any recent bleeding or bruising.   Managed and monitored by PCP 3. Urinary retention: Monitored and managed by urology (unable to view notes via epic) 4. HTN: BP goal <130/90.  Stable on amlodipine, carvedilol, hygroton, and telmisartan per PCP 5. HLD: LDL goal <70. Recent LDL 75 and initiated atorvastatin 10 mg daily.  Request follow-up with PCP in 1 to 2 months for repeat lipid panel and ongoing prescribing of atorvastatin 6. DMII: A1c goal<7.0. Recent A1c 6.0.  On glipizide and Metformin per PCP    Follow up in 3 months or call earlier if needed   CC:  [facility] provider: Dr. Edyth Gunnels, Jonny Ruiz, MD    I spent 45 minutes of face-to-face and non-face-to-face time with patient.  This included previsit chart review including hospitalization pertinent progress notes, lab work and imaging, lab review, study review, order entry, electronic health record documentation, patient education regarding recent stroke and etiology, residual deficits, importance of managing stroke risk factors and answered all other questions to patient satisfaction   Ihor Austin, AGNP-BC  [facility] [address] [place], [place] [address]  Phone [phone] Fax [phone] Note: This document was prepared with digital dictation and possible smart phrase technology. Any transcriptional errors that result from this process are unintentional.

## 2020-10-20 NOTE — Progress Notes (Signed)
I agree with the above plan 

## 2020-10-23 DIAGNOSIS — Z23 Encounter for immunization: Secondary | ICD-10-CM | POA: Diagnosis not present

## 2020-11-23 DIAGNOSIS — R338 Other retention of urine: Secondary | ICD-10-CM | POA: Diagnosis not present

## 2020-12-10 DIAGNOSIS — I10 Essential (primary) hypertension: Secondary | ICD-10-CM | POA: Diagnosis not present

## 2020-12-10 DIAGNOSIS — E7800 Pure hypercholesterolemia, unspecified: Secondary | ICD-10-CM | POA: Diagnosis not present

## 2020-12-10 DIAGNOSIS — D6869 Other thrombophilia: Secondary | ICD-10-CM | POA: Diagnosis not present

## 2020-12-10 DIAGNOSIS — E1139 Type 2 diabetes mellitus with other diabetic ophthalmic complication: Secondary | ICD-10-CM | POA: Diagnosis not present

## 2020-12-10 DIAGNOSIS — I6930 Unspecified sequelae of cerebral infarction: Secondary | ICD-10-CM | POA: Diagnosis not present

## 2020-12-10 DIAGNOSIS — E1122 Type 2 diabetes mellitus with diabetic chronic kidney disease: Secondary | ICD-10-CM | POA: Diagnosis not present

## 2020-12-22 DIAGNOSIS — R338 Other retention of urine: Secondary | ICD-10-CM | POA: Diagnosis not present

## 2020-12-25 DIAGNOSIS — E7800 Pure hypercholesterolemia, unspecified: Secondary | ICD-10-CM | POA: Diagnosis not present

## 2020-12-25 DIAGNOSIS — N400 Enlarged prostate without lower urinary tract symptoms: Secondary | ICD-10-CM | POA: Diagnosis not present

## 2020-12-25 DIAGNOSIS — N182 Chronic kidney disease, stage 2 (mild): Secondary | ICD-10-CM | POA: Diagnosis not present

## 2020-12-25 DIAGNOSIS — E1122 Type 2 diabetes mellitus with diabetic chronic kidney disease: Secondary | ICD-10-CM | POA: Diagnosis not present

## 2020-12-25 DIAGNOSIS — E1139 Type 2 diabetes mellitus with other diabetic ophthalmic complication: Secondary | ICD-10-CM | POA: Diagnosis not present

## 2020-12-25 DIAGNOSIS — I10 Essential (primary) hypertension: Secondary | ICD-10-CM | POA: Diagnosis not present

## 2020-12-25 DIAGNOSIS — I482 Chronic atrial fibrillation, unspecified: Secondary | ICD-10-CM | POA: Diagnosis not present

## 2021-01-19 DIAGNOSIS — R338 Other retention of urine: Secondary | ICD-10-CM | POA: Diagnosis not present

## 2021-02-02 DIAGNOSIS — R3916 Straining to void: Secondary | ICD-10-CM | POA: Diagnosis not present

## 2021-02-02 DIAGNOSIS — R338 Other retention of urine: Secondary | ICD-10-CM | POA: Diagnosis not present

## 2021-02-08 ENCOUNTER — Ambulatory Visit: Payer: Medicare HMO | Admitting: Adult Health

## 2021-02-08 ENCOUNTER — Encounter: Payer: Self-pay | Admitting: Adult Health

## 2021-02-08 VITALS — BP 122/77 | HR 69 | Ht 71.0 in | Wt 168.0 lb

## 2021-02-08 DIAGNOSIS — I63411 Cerebral infarction due to embolism of right middle cerebral artery: Secondary | ICD-10-CM | POA: Diagnosis not present

## 2021-02-08 NOTE — Progress Notes (Signed)
I agree with the above plan 

## 2021-02-08 NOTE — Patient Instructions (Signed)
Continue Eliquis (apixaban) daily  and atorvastatin  for secondary stroke prevention  Continue to follow up with PCP regarding cholesterol and blood pressure management  Maintain strict control of hypertension with blood pressure goal below 130/90 and cholesterol with LDL cholesterol (bad cholesterol) goal below 70 mg/dL.       Followup in the future with me in 6 months or call earlier if needed       Thank you for coming to see us at [facility]. I hope we have been able to provide you high quality care today.  You may receive a patient satisfaction survey over the next few weeks. We would appreciate your feedback and comments so that we may continue to improve ourselves and the health of our patients.  

## 2021-02-08 NOTE — Progress Notes (Signed)
[facility] [address] [place]. [place] [address] [phone]       STROKE FOLLOW UP NOTE  Jason Hunter Date of Birth:  1927-11-07 Medical Record Number:  [id number]   Reason for Referral: stroke follow up    SUBJECTIVE:   CHIEF COMPLAINT:  Chief Complaint  Patient presents with  . Follow-up    TR alone Pt is well, slowly coming out of stroke side affects. 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  He has been doing well since returning home.  He does report some speech difficulty and occasional imbalance but this has continued to improve.  Previously working for the state driving dump trucks and he does not plan on returning.  He lives in his own home but granddaughter lives with him. He does drive short distance without difficulty. Denies new or worsening stroke/TIA symptoms.  Remains on Eliquis without bleeding or bruising.  Remains on atorvastatin 10 mg daily without myalgias.  Blood pressure today 131/73.  He was evaluated in the ED on 11/20 and diagnosed with UTI with indwelling catheter placed on Keflex and advised follow-up needed with urology. He has been following with urology with foley catheter in  place and denies any recent evidence of hematuria.  No concerns at this time.  Stroke admission 09/10/2020 Jason Hunter is a 87 year old African-American male with PMHx significant of HTN, HLD, A. fib on Eliquis and DM with sudden onset of slurred speech and left facial droop on 09/10/2020.  Personally reviewed hospitalization pertinent progress notes, lab work and imaging with summary provided.  Evaluated by Dr. Pearlean Brownie with stroke work-up revealing acute right MCA territory infarcts secondary to AF on Eliquis although reported some noncompliance due to occasional bloody penile discharge. recommended continuation of Eliquis for secondary stroke prevention and history of A. fib.  LDL 75 and initiated atorvastatin 10 mg daily.  Controlled DM with A1c 6.0.  HTN stable.  On 09/11/2020, developed urinary retention with unsuccessful Foley catheter placements with eventual placement by urology requiring intermittent irrigation and advised follow-up outpatient.  Evaluated by therapy with residual imbalance and cognitive impairment with decreased awareness of deficits and recommended discharge home with home health therapies and 24/7 supervision.  Stroke:   Patchy R MCA territory infarct embolic secondary to AF on Eliquis but not compliant d/t recent small specks of blood in stool  Code Stroke CT head No acute abnormality. Small vessel disease. ASPECTS 10.     MRI  Patchy R MCA territory infarcts.  MRA  Unremarkable   Carotid Doppler  B ICA 1-39% stenosis, VAs antegrade   2D Echo EF 60-65%. LA mildly dilated. Pt in AF  LDL 75  HgbA1c 6.0  VTE prophylaxis - Eliquis  Eliquis (apixaban) daily prior to admission but had missed  some doses, now on aspirin 325 mg daily and Eliquis (apixaban) daily. No indication for additional aspirin. Will stop. Continue eliquis alone without missing doses.    Therapy recommendations:  HH PT, RW  Disposition:  Return home   ROS:   14 system review of systems  performed and negative with exception of those listed in HPI  PMH:  Past Medical History:  Diagnosis Date  . Atrial fibrillation (HCC)   . Diabetes mellitus without complication (HCC)   . Diabetes with neurologic complications (HCC)   . Dyslipidemia   . Essential hypertension   . Hypertension     PSH: History reviewed. No pertinent surgical history.  Social History:  Social History   Socioeconomic History  . Marital status: Widowed    Spouse name: Not on file  . Number of children: Not on file  . Years of education: Not on file  . Highest education level: Not on file  Occupational History  . 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 Smoking status: Never Smoker  . Smokeless tobacco: Never Used  Substance and Sexual Activity  . Alcohol use: Never  . Drug use: Never  . Sexual activity: Not on file  Other Topics Concern  . Not on file  Social History Narrative   ** Merged History Encounter **       Social Determinants of Health   Financial Resource Strain: Not on file  Food Insecurity: Not on file  Transportation Needs: Not on file  Physical Activity: Not on file  Stress: Not on file  Social Connections: Not on file  Intimate Partner Violence: Not on file    Family History: History reviewed. No pertinent family history.  Medications:   Current Outpatient Medications on File Prior to Visit  Medication Sig Dispense Refill  . amLODipine (NORVASC) 10 MG tablet Take 10 mg by mouth daily.    . carvedilol (COREG) 25 MG tablet Take 25 mg by mouth 2 (two) times daily.    Marland Kitchen ELIQUIS 5 MG TABS tablet Take 5 mg by mouth 2 (two) times daily.    . metFORMIN (GLUCOPHAGE-XR) 500 MG 24 hr tablet Take 500 mg by mouth 2 (two) times daily.    . tamsulosin (FLOMAX) 0.4 MG CAPS capsule     . telmisartan (MICARDIS) 80 MG tablet Take 80 mg by mouth daily.  0   No current facility-administered medications on file prior to visit.    Allergies:  No Known  Allergies    OBJECTIVE:  Physical Exam  Vitals:   02/08/21 0908  BP: 122/77  Pulse: 69  Weight: 168 lb (76.2 kg)  Height: 5\' 11"  (1.803 m)   Body mass index is 23.43 kg/m. No exam data present  General: well developed, well nourished, very pleasant elderly African-American male, seated, in no evident distress Head: head normocephalic and atraumatic.   Neck: supple with no carotid or supraclavicular bruits Cardiovascular: irregular rate and rhythm, no murmurs Musculoskeletal: no deformity Skin:  no rash/petichiae Vascular:  Normal pulses all extremities   Neurologic Exam Mental Status: Awake and fully alert. Mild dysarthria. Oriented to place and time. Recent memory mildly subjectively impaired and remote memory intact. Attention span, concentration and fund of knowledge appropriate. Mood and affect appropriate.  Cranial Nerves: Fundoscopic exam reveals sharp disc margins. Pupils equal, briskly reactive to light. Extraocular movements full without nystagmus. Visual fields full to confrontation. Hearing intact. Facial sensation intact.  Very slight Left lower facial weakness.  Tongue and palate moves  normally and symmetrically.  Motor: Normal bulk and tone. Normal strength in all tested extremities Sensory.: intact to touch , pinprick , position and vibratory sensation.  Coordination: Rapid alternating movements normal in all extremities. Finger-to-nose and heel-to-shin performed accurately bilaterally. Gait and Station: Arises from chair without difficulty. Stance is normal. Gait demonstrates normal stride length and balance without use of assistive device.  Difficulty performing heel toe and tandem walk. Reflexes: 1+ and symmetric. Toes downgoing.         ASSESSMENT: Jason Hunter is a 87 y.o. year old male presented with left-sided weakness and facial droop on 09/10/2020 with stroke work-up revealing patchy right MCA territory infarcts, embolic secondary to known AF on  Eliquis but noncompliant due to occasional bloody urine. Vascular risk factors include A. fib, HTN, HLD, DM and advanced age.  Hospital course complicated by urinary retention with traumatic Foley attempt.  Foley catheter remains in place monitored by urology.     PLAN:  1. R MCA stroke :  a. Residual deficit: Mild dysarthria, left lower facial weakness and mild imbalance but overall greatly improving since prior visit b. Continue Eliquis (apixaban) daily  and atorvastatin 10 mg daily for secondary stroke prevention.   c. Discussed secondary stroke prevention measures and importance of close PCP follow up for aggressive stroke risk factor management - per pt, PCP completed lab work in Feb which was normal per patient (unable to view via epic) d. HTN: BP goal <130/90.  Well-controlled on current regimen per PCP e. HLD: LDL goal <70.  On atorvastatin 10 mg daily per PCP.  f. DMII: A1c goal<7.0. On glipizide and Metformin per PCP 2. A. Fib: On Eliquis 5 mg twice daily for CHA2DS2-VASc score of at least 6 managed by PCP. He has been compliant with any missed dosages or recurring hematuria concerns 3. Urinary retention: Monitored and managed by urology (unable to view notes via epic)    Follow up in 6 months or call earlier if needed   CC:  [facility] provider: Dr. Edyth Gunnels, Jonny Ruiz, MD    I spent 30 minutes of face-to-face and non-face-to-face time with patient.  This included previsit chart review, lab review, study review, order entry, electronic health record documentation, patient education regarding prior stroke and etiology, residual deficits, importance of managing stroke risk factors and answered all other questions to patient satisfaction   Ihor Austin, [facility]  [facility] [address] [place], [place] [address]  Phone [phone] Fax [phone] Note: This document was prepared with digital dictation and possible smart phrase technology. Any  transcriptional errors that result from this process are unintentional.

## 2021-02-25 DIAGNOSIS — E1122 Type 2 diabetes mellitus with diabetic chronic kidney disease: Secondary | ICD-10-CM | POA: Diagnosis not present

## 2021-02-25 DIAGNOSIS — N182 Chronic kidney disease, stage 2 (mild): Secondary | ICD-10-CM | POA: Diagnosis not present

## 2021-02-25 DIAGNOSIS — I10 Essential (primary) hypertension: Secondary | ICD-10-CM | POA: Diagnosis not present

## 2021-02-25 DIAGNOSIS — E7800 Pure hypercholesterolemia, unspecified: Secondary | ICD-10-CM | POA: Diagnosis not present

## 2021-02-25 DIAGNOSIS — E1139 Type 2 diabetes mellitus with other diabetic ophthalmic complication: Secondary | ICD-10-CM | POA: Diagnosis not present

## 2021-02-25 DIAGNOSIS — N400 Enlarged prostate without lower urinary tract symptoms: Secondary | ICD-10-CM | POA: Diagnosis not present

## 2021-02-25 DIAGNOSIS — I482 Chronic atrial fibrillation, unspecified: Secondary | ICD-10-CM | POA: Diagnosis not present

## 2021-03-02 DIAGNOSIS — R338 Other retention of urine: Secondary | ICD-10-CM | POA: Diagnosis not present

## 2021-04-01 ENCOUNTER — Inpatient Hospital Stay (HOSPITAL_COMMUNITY)
Admission: EM | Admit: 2021-04-01 | Discharge: 2021-04-30 | DRG: 871 | Disposition: E | Payer: Medicare HMO | Attending: Pulmonary Disease | Admitting: Pulmonary Disease

## 2021-04-01 ENCOUNTER — Emergency Department (HOSPITAL_COMMUNITY): Payer: Medicare HMO

## 2021-04-01 DIAGNOSIS — R339 Retention of urine, unspecified: Secondary | ICD-10-CM

## 2021-04-01 DIAGNOSIS — Z20822 Contact with and (suspected) exposure to covid-19: Secondary | ICD-10-CM | POA: Diagnosis not present

## 2021-04-01 DIAGNOSIS — E785 Hyperlipidemia, unspecified: Secondary | ICD-10-CM | POA: Diagnosis present

## 2021-04-01 DIAGNOSIS — N179 Acute kidney failure, unspecified: Secondary | ICD-10-CM | POA: Diagnosis not present

## 2021-04-01 DIAGNOSIS — R338 Other retention of urine: Secondary | ICD-10-CM

## 2021-04-01 DIAGNOSIS — M542 Cervicalgia: Secondary | ICD-10-CM | POA: Diagnosis not present

## 2021-04-01 DIAGNOSIS — E861 Hypovolemia: Secondary | ICD-10-CM | POA: Diagnosis present

## 2021-04-01 DIAGNOSIS — J9600 Acute respiratory failure, unspecified whether with hypoxia or hypercapnia: Secondary | ICD-10-CM | POA: Diagnosis not present

## 2021-04-01 DIAGNOSIS — Z7901 Long term (current) use of anticoagulants: Secondary | ICD-10-CM | POA: Diagnosis not present

## 2021-04-01 DIAGNOSIS — R29810 Facial weakness: Secondary | ICD-10-CM | POA: Diagnosis not present

## 2021-04-01 DIAGNOSIS — I468 Cardiac arrest due to other underlying condition: Secondary | ICD-10-CM | POA: Diagnosis not present

## 2021-04-01 DIAGNOSIS — R571 Hypovolemic shock: Secondary | ICD-10-CM | POA: Diagnosis not present

## 2021-04-01 DIAGNOSIS — I469 Cardiac arrest, cause unspecified: Secondary | ICD-10-CM | POA: Diagnosis not present

## 2021-04-01 DIAGNOSIS — G9341 Metabolic encephalopathy: Secondary | ICD-10-CM | POA: Diagnosis not present

## 2021-04-01 DIAGNOSIS — A419 Sepsis, unspecified organism: Secondary | ICD-10-CM | POA: Diagnosis not present

## 2021-04-01 DIAGNOSIS — R6521 Severe sepsis with septic shock: Secondary | ICD-10-CM

## 2021-04-01 DIAGNOSIS — Z960 Presence of urogenital implants: Secondary | ICD-10-CM | POA: Diagnosis present

## 2021-04-01 DIAGNOSIS — Z7984 Long term (current) use of oral hypoglycemic drugs: Secondary | ICD-10-CM

## 2021-04-01 DIAGNOSIS — I4891 Unspecified atrial fibrillation: Secondary | ICD-10-CM | POA: Diagnosis present

## 2021-04-01 DIAGNOSIS — I517 Cardiomegaly: Secondary | ICD-10-CM | POA: Diagnosis not present

## 2021-04-01 DIAGNOSIS — R6520 Severe sepsis without septic shock: Secondary | ICD-10-CM | POA: Diagnosis not present

## 2021-04-01 DIAGNOSIS — J189 Pneumonia, unspecified organism: Secondary | ICD-10-CM | POA: Diagnosis not present

## 2021-04-01 DIAGNOSIS — D65 Disseminated intravascular coagulation [defibrination syndrome]: Secondary | ICD-10-CM | POA: Diagnosis not present

## 2021-04-01 DIAGNOSIS — N10 Acute tubulo-interstitial nephritis: Secondary | ICD-10-CM | POA: Diagnosis not present

## 2021-04-01 DIAGNOSIS — Z8673 Personal history of transient ischemic attack (TIA), and cerebral infarction without residual deficits: Secondary | ICD-10-CM

## 2021-04-01 DIAGNOSIS — I959 Hypotension, unspecified: Secondary | ICD-10-CM | POA: Diagnosis not present

## 2021-04-01 DIAGNOSIS — Z79899 Other long term (current) drug therapy: Secondary | ICD-10-CM

## 2021-04-01 DIAGNOSIS — J80 Acute respiratory distress syndrome: Secondary | ICD-10-CM | POA: Diagnosis not present

## 2021-04-01 DIAGNOSIS — I10 Essential (primary) hypertension: Secondary | ICD-10-CM | POA: Diagnosis present

## 2021-04-01 DIAGNOSIS — Z515 Encounter for palliative care: Secondary | ICD-10-CM

## 2021-04-01 DIAGNOSIS — I69892 Facial weakness following other cerebrovascular disease: Secondary | ICD-10-CM

## 2021-04-01 DIAGNOSIS — N281 Cyst of kidney, acquired: Secondary | ICD-10-CM | POA: Diagnosis not present

## 2021-04-01 DIAGNOSIS — E11649 Type 2 diabetes mellitus with hypoglycemia without coma: Secondary | ICD-10-CM | POA: Diagnosis not present

## 2021-04-01 DIAGNOSIS — A4159 Other Gram-negative sepsis: Secondary | ICD-10-CM | POA: Diagnosis not present

## 2021-04-01 DIAGNOSIS — N400 Enlarged prostate without lower urinary tract symptoms: Secondary | ICD-10-CM | POA: Diagnosis not present

## 2021-04-01 DIAGNOSIS — R4781 Slurred speech: Secondary | ICD-10-CM | POA: Diagnosis not present

## 2021-04-01 DIAGNOSIS — Z66 Do not resuscitate: Secondary | ICD-10-CM | POA: Diagnosis not present

## 2021-04-01 DIAGNOSIS — Z01818 Encounter for other preprocedural examination: Secondary | ICD-10-CM

## 2021-04-01 DIAGNOSIS — Z781 Physical restraint status: Secondary | ICD-10-CM

## 2021-04-01 DIAGNOSIS — I69822 Dysarthria following other cerebrovascular disease: Secondary | ICD-10-CM | POA: Diagnosis not present

## 2021-04-01 DIAGNOSIS — M881 Osteitis deformans of vertebrae: Secondary | ICD-10-CM | POA: Diagnosis not present

## 2021-04-01 LAB — RAPID URINE DRUG SCREEN, HOSP PERFORMED
Amphetamines: NOT DETECTED
Barbiturates: NOT DETECTED
Benzodiazepines: NOT DETECTED
Cocaine: NOT DETECTED
Opiates: NOT DETECTED
Tetrahydrocannabinol: NOT DETECTED

## 2021-04-01 LAB — COMPREHENSIVE METABOLIC PANEL
ALT: 21 U/L (ref 0–44)
AST: 65 U/L — ABNORMAL HIGH (ref 15–41)
Albumin: 3 g/dL — ABNORMAL LOW (ref 3.5–5.0)
Alkaline Phosphatase: 99 U/L (ref 38–126)
Anion gap: 14 (ref 5–15)
BUN: 24 mg/dL — ABNORMAL HIGH (ref 8–23)
CO2: 15 mmol/L — ABNORMAL LOW (ref 22–32)
Calcium: 8 mg/dL — ABNORMAL LOW (ref 8.9–10.3)
Chloride: 106 mmol/L (ref 98–111)
Creatinine, Ser: 2.32 mg/dL — ABNORMAL HIGH (ref 0.61–1.24)
GFR, Estimated: 26 mL/min — ABNORMAL LOW (ref >60)
Glucose, Bld: 121 mg/dL — ABNORMAL HIGH (ref 70–99)
Potassium: 5.2 mmol/L — ABNORMAL HIGH (ref 3.5–5.1)
Sodium: 135 mmol/L (ref 135–145)
Total Bilirubin: 1.8 mg/dL — ABNORMAL HIGH (ref 0.3–1.2)
Total Protein: 5.8 g/dL — ABNORMAL LOW (ref 6.5–8.1)

## 2021-04-01 LAB — URINALYSIS, ROUTINE W REFLEX MICROSCOPIC
Bilirubin Urine: NEGATIVE
Glucose, UA: NEGATIVE mg/dL
Ketones, ur: 5 mg/dL — AB
Nitrite: NEGATIVE
Protein, ur: 100 mg/dL — AB
Specific Gravity, Urine: 1.019 (ref 1.005–1.030)
pH: 6 (ref 5.0–8.0)

## 2021-04-01 LAB — DIFFERENTIAL
Abs Immature Granulocytes: 0 10*3/uL (ref 0.00–0.07)
Basophils Absolute: 0 10*3/uL (ref 0.0–0.1)
Basophils Relative: 0 %
Eosinophils Absolute: 0 10*3/uL (ref 0.0–0.5)
Eosinophils Relative: 0 %
Lymphocytes Relative: 4 %
Lymphs Abs: 0.6 10*3/uL — ABNORMAL LOW (ref 0.7–4.0)
Monocytes Absolute: 0.2 10*3/uL (ref 0.1–1.0)
Monocytes Relative: 1 %
Neutro Abs: 14.4 10*3/uL — ABNORMAL HIGH (ref 1.7–7.7)
Neutrophils Relative %: 95 %
nRBC: 0 /100 WBC

## 2021-04-01 LAB — I-STAT CHEM 8, ED
BUN: 36 mg/dL — ABNORMAL HIGH (ref 8–23)
BUN: 23 mg/dL (ref 8–23)
Calcium, Ion: 1.02 mmol/L — ABNORMAL LOW (ref 1.15–1.40)
Calcium, Ion: 0.96 mmol/L — ABNORMAL LOW (ref 1.15–1.40)
Chloride: 107 mmol/L (ref 98–111)
Chloride: 108 mmol/L (ref 98–111)
Creatinine, Ser: 2.2 mg/dL — ABNORMAL HIGH (ref 0.61–1.24)
Creatinine, Ser: 2 mg/dL — ABNORMAL HIGH (ref 0.61–1.24)
Glucose, Bld: 115 mg/dL — ABNORMAL HIGH (ref 70–99)
Glucose, Bld: 88 mg/dL (ref 70–99)
HCT: 34 % — ABNORMAL LOW (ref 39.0–52.0)
HCT: 32 % — ABNORMAL LOW (ref 39.0–52.0)
Hemoglobin: 11.6 g/dL — ABNORMAL LOW (ref 13.0–17.0)
Hemoglobin: 10.9 g/dL — ABNORMAL LOW (ref 13.0–17.0)
Potassium: 5.2 mmol/L — ABNORMAL HIGH (ref 3.5–5.1)
Potassium: 4.1 mmol/L (ref 3.5–5.1)
Sodium: 135 mmol/L (ref 135–145)
Sodium: 137 mmol/L (ref 135–145)
TCO2: 16 mmol/L — ABNORMAL LOW (ref 22–32)
TCO2: 13 mmol/L — ABNORMAL LOW (ref 22–32)

## 2021-04-01 LAB — CBC
HCT: 34.3 % — ABNORMAL LOW (ref 39.0–52.0)
Hemoglobin: 11 g/dL — ABNORMAL LOW (ref 13.0–17.0)
MCH: 30.6 pg (ref 26.0–34.0)
MCHC: 32.1 g/dL (ref 30.0–36.0)
MCV: 95.3 fL (ref 80.0–100.0)
Platelets: 256 10*3/uL (ref 150–400)
RBC: 3.6 MIL/uL — ABNORMAL LOW (ref 4.22–5.81)
RDW: 15.9 % — ABNORMAL HIGH (ref 11.5–15.5)
WBC: 15.2 10*3/uL — ABNORMAL HIGH (ref 4.0–10.5)
nRBC: 0 % (ref 0.0–0.2)

## 2021-04-01 LAB — RESP PANEL BY RT-PCR (FLU A&B, COVID) ARPGX2
Influenza A by PCR: NEGATIVE
Influenza B by PCR: NEGATIVE
SARS Coronavirus 2 by RT PCR: NEGATIVE

## 2021-04-01 LAB — ETHANOL: Alcohol, Ethyl (B): <10 mg/dL (ref <10)

## 2021-04-01 LAB — PROTIME-INR
INR: 2.7 — ABNORMAL HIGH (ref 0.8–1.2)
Prothrombin Time: 28.4 seconds — ABNORMAL HIGH (ref 11.4–15.2)

## 2021-04-01 LAB — LACTIC ACID, PLASMA
Lactic Acid, Venous: 8 mmol/L (ref 0.5–1.9)
Lactic Acid, Venous: 7.4 mmol/L (ref 0.5–1.9)

## 2021-04-01 LAB — APTT: aPTT: 37 seconds — ABNORMAL HIGH (ref 24–36)

## 2021-04-01 LAB — CBG MONITORING, ED: Glucose-Capillary: 120 mg/dL — ABNORMAL HIGH (ref 70–99)

## 2021-04-01 MED ORDER — VANCOMYCIN HCL 1500 MG/300ML IV SOLN
1500.0000 mg | Freq: Once | INTRAVENOUS | Status: AC
  Administered 2021-04-01: 1500 mg via INTRAVENOUS
  Filled 2021-04-01: qty 300

## 2021-04-01 MED ORDER — LACTATED RINGERS IV BOLUS
2000.0000 mL | Freq: Once | INTRAVENOUS | Status: AC
  Administered 2021-04-01: 2000 mL via INTRAVENOUS

## 2021-04-01 MED ORDER — LACTATED RINGERS IV SOLN: INTRAVENOUS | Status: DC

## 2021-04-01 MED ORDER — SODIUM CHLORIDE 0.9 % IV SOLN
2.0000 g | INTRAVENOUS | Status: DC
  Administered 2021-04-01: 2 g via INTRAVENOUS
  Filled 2021-04-01: qty 2

## 2021-04-01 MED ORDER — SODIUM CHLORIDE 0.9 % IV BOLUS
1000.0000 mL | Freq: Once | INTRAVENOUS | Status: AC
  Administered 2021-04-01: 1000 mL via INTRAVENOUS

## 2021-04-01 MED ORDER — POLYETHYLENE GLYCOL 3350 17 G PO PACK: 17.0000 g | PACK | Freq: Every day | ORAL | Status: DC | PRN

## 2021-04-01 MED ORDER — DOCUSATE SODIUM 100 MG PO CAPS: 100.0000 mg | ORAL_CAPSULE | Freq: Two times a day (BID) | ORAL | Status: DC | PRN

## 2021-04-01 MED ORDER — LACTATED RINGERS IV BOLUS
1000.0000 mL | Freq: Once | INTRAVENOUS | Status: AC
  Administered 2021-04-01: 1000 mL via INTRAVENOUS

## 2021-04-01 MED ORDER — VANCOMYCIN HCL 1000 MG/200ML IV SOLN: 1000.0000 mg | INTRAVENOUS | Status: DC

## 2021-04-01 MED ORDER — TAMSULOSIN HCL 0.4 MG PO CAPS: 0.4000 mg | ORAL_CAPSULE | Freq: Every day | ORAL | Status: DC

## 2021-04-01 MED ORDER — NOREPINEPHRINE 4 MG/250ML-% IV SOLN
0.0000 ug/min | INTRAVENOUS | Status: DC
  Administered 2021-04-01: 2 ug/min via INTRAVENOUS
  Administered 2021-04-01: 15 ug/min via INTRAVENOUS
  Administered 2021-04-02: 18 ug/min via INTRAVENOUS
  Filled 2021-04-01 (×2): qty 250

## 2021-04-01 MED ORDER — CALCIUM GLUCONATE-NACL 1-0.675 GM/50ML-% IV SOLN
1.0000 g | Freq: Once | INTRAVENOUS | Status: AC
  Administered 2021-04-01: 1000 mg via INTRAVENOUS
  Filled 2021-04-01: qty 50

## 2021-04-01 NOTE — H&P (Addendum)
NAME:  Jason Hunter, MRN:  [id number], DOB:  1928/07/25, LOS: 0 ADMISSION DATE:  04/14/2021, CONSULTATION DATE:  04/05/2021 REFERRING MD:  Particia Nearing CHIEF COMPLAINT:  Hypotension   History of Present Illness:  Jason Hunter is a 87 y.o. male who presented to [facility] ED 04/25/2021 with generalized weakness and AMS.  He was apparently in his usual state of health earlier that morning but was found on the floor next to his bed around 11 AM.  He got back in bed with the assistance of his son who he lives with.  Son states he was pretty much his normal self throughout the rest of the day however later that afternoon, he told son to take him to the ED because "he did not feel good".  Patient tells me that he ate some rice and hamburger meat that son had cooked and after that, started to feel bad.  Of note, he had a recent admission for CVA on 09/10/2020 and since then, has had ongoing left-sided deficits.  Following this, he also had urinary retention and has since had a chronic Foley in place.   Due to AMS and left-sided deficits, code stroke was initially called.  He was seen by neurology and symptoms were felt to be more in line with hypotension and infection; therefore, code stroke was canceled.  CT head was negative for an acute process.  CT abdomen/pelvis was suspicious for UTI and UA further suggested UTI. In ED, he was hypotensive with systolics in the 70s.  He received 4 L of crystalloid with minimal improvement.  He was subsequently started on Levophed.  PCCM was asked to see in consultation. At the time of evaluation, patient was on 15 mcg/min of Levophed.  His mucous membranes were extremely dry and he had significant skin tenting.  I ordered 2 more liters of LR in hopes that we can wean his Levophed down. His chronic Foley has been changed for a new one in the ED.  Pertinent  Medical History:  has Atrial fibrillation (HCC); Diabetes with neurologic complications (HCC); Dyslipidemia; Essential  hypertension; CVA (cerebral vascular accident) (HCC); Acute urinary retention; and Septic shock (HCC) on their problem list.  Significant Hospital Events: Including procedures, antibiotic start and stop dates in addition to other pertinent events   6/2 > admit.  Micro Data: Blood 6/2 > Urine 6/2 >  Flu 6/2 > neg. SARS CoV2 6/2 > neg.  Antibiotics: Vanc 6/2 >  Cefepime 6/2 >   Interim History / Subjective:  Elderly man, comfortable, denies any complaints.  States "I feel pretty good"  Objective:  Blood pressure 90/66, pulse (!) 56, temperature 98.2 F (36.8 C), temperature source Rectal, resp. rate 20, height 5\' 11"  (1.803 m), weight 76.2 kg, SpO2 100 %.        Intake/Output Summary (Last 24 hours) at 04/14/2021 2143 Last data filed at 04/18/2021 1845 Gross per 24 hour  Intake 4100 ml  Output --  Net 4100 ml   Filed Weights   04/21/2021 1808  Weight: 76.2 kg    Examination: General: Elderly man, resting in bed, in NAD. Neuro: A&O x 3, no deficits. HEENT: [place]/AT. Sclerae anicteric. EOMI.  MM very dry. Cardiovascular: IRIR, no M/R/G.  Lungs: Respirations even and unlabored.  CTA bilaterally, No W/R/R. Abdomen: BS x 4, soft, NT/ND.  Musculoskeletal: No gross deformities, no edema.  Skin: Intact, warm, significant skin tenting. GU: Chronic foley, just replaced in ED with no new urine noted.  Labs/imaging personally reviewed:  CT abdomen/pelvis 6/2 > bilateral perinephric and periureteral edema, left greater than right suspicious for UTI.  Gallbladder sludge.  Colonic diverticulosis without diverticulitis.  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  Mild right pleural effusion. CT head 6/2 > chronic small infarcts.  No acute process.  Resolved Hospital Problem list:    Assessment & Plan:   Shock - multifactorial with component of septic in setting UTI (has had chronic Foley since 09/10/2020, this was replaced in ED 04/22/2021) as well as hypovolemic. - Additional 2 L LR  now. - Continue Levophed for goal MAP > 60, hope that we can wean this down as additional fluids are received. - Continue empiric antibiotics. - Follow cultures.  AKI -thought to be secondary to hypovolemia and sepsis.  Also exacerbated by chronic ARB use. - Additional fluids as above. - Strict I/O's. - Hold home Telmisartan. - Follow BMP.  Hypocalcemia.  - 1 g calcium gluconate.  Hx atrial fibrillation (on Eliquis), HTN. - Hold home Eliquis for now. - Consider Heparin vs resume Eliquis once INR approaches 2. - Hold home Carvedilol, Amlodipine, Telmisartan.  Hx DM. - SSI if glucose consistently > 180. - Hold home Metformin.  Hx urinary retention - s/p chronic foley (replaced in ED). - Continue home Flomax.  Best practice (evaluated daily):  Diet:  Heart healthy Pain/Anxiety/Delirium protocol (if indicated): No VAP protocol (if indicated): Not indicated DVT prophylaxis: On hold for now GI prophylaxis: N/A Glucose control:  SSI No - can start if glucose consistently > 180 Central venous access:  N/A Arterial line:  N/A Foley:  N/A Mobility:  bed rest  PT consulted: N/A Last date of multidisciplinary goals of care discussion: None yet Code Status:  full code Disposition: ICU  Labs   CBC: Recent Labs  Lab 04/07/2021 1707 04/17/2021 1726 04/13/2021 2049  WBC 15.2*  --   --   NEUTROABS 14.4*  --   --   HGB 11.0* 11.6* 10.9*  HCT 34.3* 34.0* 32.0*  MCV 95.3  --   --   PLT 256  --   --     Basic Metabolic Panel: Recent Labs  Lab 04/27/2021 1707 03/31/2021 1726 04/12/2021 2049  NA 135 135 137  K 5.2* 5.2* 4.1  CL 106 107 108  CO2 15*  --   --   GLUCOSE 121* 115* 88  BUN 24* 36* 23  CREATININE 2.32* 2.20* 2.00*  CALCIUM 8.0*  --   --    GFR: Estimated Creatinine Clearance: 26.1 mL/min (A) (by C-G formula based on SCr of 2 mg/dL (H)). Recent Labs  Lab 04/21/2021 1707 04/07/2021 1900  WBC 15.2*  --   LATICACIDVEN  --  8.0*    Liver Function Tests: Recent Labs   Lab 04/08/2021 1707  AST 65*  ALT 21  ALKPHOS 99  BILITOT 1.8*  PROT 5.8*  ALBUMIN 3.0*   No results for input(s): LIPASE, AMYLASE in the last 168 hours. No results for input(s): AMMONIA in the last 168 hours.  ABG    Component Value Date/Time   TCO2 13 (L) 04/24/2021 2049     Coagulation Profile: Recent Labs  Lab 04/17/2021 1707  INR 2.7*    Cardiac Enzymes: No results for input(s): CKTOTAL, CKMB, CKMBINDEX, TROPONINI in the last 168 hours.  HbA1C: Hgb A1c MFr Bld  Date/Time Value Ref Range Status  09/11/2020 03:19 AM 6.0 (H) 4.8 - 5.6 % Final    Comment:    (  NOTE) Pre diabetes:          5.7%-6.4%  Diabetes:              >6.4%  Glycemic control for   <7.0% adults with diabetes   07/29/2007 04:20 AM (H)  Final   7.1 (NOTE)   The ADA recommends the following therapeutic goals for glycemic   control related to Hgb A1C measurement:   Goal of Therapy:   < 7.0% Hgb A1C   Action Suggested:  > 8.0% Hgb A1C   Ref:  Diabetes Care, 22, Suppl. 1, 1999    CBG: Recent Labs  Lab 03/31/2021 1701  GLUCAP 120*    Review of Systems:   All negative; except for those that are bolded, which indicate positives.  Constitutional: weight loss, weight gain, night sweats, fevers, chills, fatigue, weakness.  HEENT: headaches, sore throat, sneezing, nasal congestion, post nasal drip, difficulty swallowing, tooth/dental problems, visual complaints, visual changes, ear aches. Neuro: difficulty with speech, weakness, numbness, ataxia. CV:  chest pain, orthopnea, PND, swelling in lower extremities, dizziness, palpitations, syncope.  Resp: cough, hemoptysis, dyspnea, wheezing. GI: heartburn, indigestion, abdominal pain, nausea, vomiting, diarrhea, constipation, change in bowel habits, loss of appetite, hematemesis, melena, hematochezia, decreased fluid intake. GU: dysuria, change in color of urine, urgency or frequency, flank pain, hematuria. MSK: joint pain or swelling, decreased range of  motion. Psych: change in mood or affect, depression, anxiety, suicidal ideations, homicidal ideations. Skin: rash, itching, bruising.   Past Medical History:  He,  has a past medical history of Atrial fibrillation (HCC), Diabetes mellitus without complication (HCC), Diabetes with neurologic complications (HCC), Dyslipidemia, Essential hypertension, and Hypertension.   Surgical History:  No past surgical history on file.   Social History:   reports that he has never smoked. He has never used smokeless tobacco. He reports that he does not drink alcohol and does not use drugs.   Family History:  His family history is not on file.   Allergies No Known Allergies   Home Medications  Prior to Admission medications   Medication Sig Start Date End Date Taking? Authorizing Provider  amLODipine (NORVASC) 10 MG tablet Take 10 mg by mouth daily. 08/18/20   [provider]  carvedilol (COREG) 25 MG tablet Take 25 mg by mouth 2 (two) times daily. 08/29/20   [provider]  ELIQUIS 5 MG TABS tablet Take 5 mg by mouth 2 (two) times daily. 07/24/20   [provider]  metFORMIN (GLUCOPHAGE-XR) 500 MG 24 hr tablet Take 500 mg by mouth 2 (two) times daily. 06/20/20   [provider]  tamsulosin (FLOMAX) 0.4 MG CAPS capsule  11/23/20   [provider]  telmisartan (MICARDIS) 80 MG tablet Take 80 mg by mouth daily. 06/07/16   [provider]     Critical care time: 35 min.   Rutherford Guys, PA - [facility] For pager details, please see AMION or use Epic chat  After 1900, please call [facility] for cross coverage needs 04/09/2021, 9:43 PM

## 2021-04-01 NOTE — Progress Notes (Signed)
Pharmacy Antibiotic Note  Jason Hunter is a 87 y.o. male admitted on 04/16/2021 with sepsis.  Pharmacy has been consulted for vancomycin and cefepime dosing.  Patient with chronic foley, altered mental status, afebrile and leukocytosis to 15.2. Hypotensive requiring fluid bolus and norepi gtt.   Plan: Vancomycin 1500mg  x1 IV loading dose Then vancomycin 1000 q36h (predicted AUC 498 w/ Scr of 2.2) Cefepime 2g q24hr Follow renal function, cultures  Height: 5\' 11"  (180.3 cm) Weight: 76.2 kg (167 lb 15.9 oz) IBW/kg (Calculated) : 75.3  Temp (24hrs), Avg:98.2 F (36.8 C), Min:98.2 F (36.8 C), Max:98.2 F (36.8 C)  Recent Labs  Lab 04/20/2021 1707 04/09/2021 1726  WBC 15.2*  --   CREATININE 2.32* 2.20*    Estimated Creatinine Clearance: 23.8 mL/min (A) (by C-G formula based on SCr of 2.2 mg/dL (H)).    No Known Allergies  Antimicrobials this admission: 6/2 vanc >> 6/2 cefepime >>  Dose adjustments this admission: NA  Microbiology results: 6/2 BCx: sent 6/2 UCx: ordered   Thank you for allowing pharmacy to be a part of this patient's care.  8/2, PharmD PGY1 Pharmacy Resident 04/26/2021 7:02 PM

## 2021-04-01 NOTE — Sepsis Progress Note (Signed)
Notified bedside nurse of need to draw lactic acid.  

## 2021-04-01 NOTE — ED Provider Notes (Signed)
[facility] EMERGENCY DEPARTMENT Provider Note   CSN: [id number] Arrival date & time: 04/23/2021  1657  An emergency department physician performed an initial assessment on this suspected stroke patient at 1716.  History Chief Complaint  Patient presents with  . Hypotension    Jason Hunter is a 87 y.o. male with PMH/o Afib (On eliquis), DM, HTN brought in by son for evaluation of AMS.  Son states that he found this patient this morning at around 9-9:30 AM on the floor next to the bed.  He states that he, for [place] at the that time, he was acting his normal self.  Patient states that he stayed at the house throughout the day had noted that patient was at baseline.  He reports that about an hour prior to ED arrival, patient came to the son and asked him to take him to the hospital because "he did not feel good."  Son states that on the way there, he became gradually more altered, confused.  Son states he had a recent stroke few weeks ago which has led him with some left-sided deficits.  He has not had any recent sicknesses, fevers, vomiting.  Son did note some diarrhea.  EM LEVEL 5 CAVEAT DUE TO AMS  The history is provided by the patient.       Past Medical History:  Diagnosis Date  . Atrial fibrillation (HCC)   . Diabetes mellitus without complication (HCC)   . Diabetes with neurologic complications (HCC)   . Dyslipidemia   . Essential hypertension   . Hypertension     Patient Active Problem List   Diagnosis Date Noted  . Sepsis (HCC) 04/08/2021  . Hypovolemic shock (HCC)   . AKI (acute kidney injury) (HCC)   . Hypocalcemia   . Urinary retention 09/12/2020  . CVA (cerebral vascular accident) (HCC) 09/10/2020  . Atrial fibrillation (HCC)   . Diabetes with neurologic complications (HCC)   . Dyslipidemia   . Essential hypertension     No past surgical history on file.     No family history on file.  Social History   Tobacco Use  . Smoking  status: Never Smoker  . Smokeless tobacco: Never Used  Substance Use Topics  . Alcohol use: Never  . Drug use: Never    Home Medications Prior to Admission medications   Medication Sig Start Date End Date Taking? Authorizing Provider  amLODipine (NORVASC) 10 MG tablet Take 10 mg by mouth daily. 08/18/20   [provider]  carvedilol (COREG) 25 MG tablet Take 25 mg by mouth 2 (two) times daily. 08/29/20   [provider]  ELIQUIS 5 MG TABS tablet Take 5 mg by mouth 2 (two) times daily. 07/24/20   [provider]  metFORMIN (GLUCOPHAGE-XR) 500 MG 24 hr tablet Take 500 mg by mouth 2 (two) times daily. 06/20/20   [provider]  tamsulosin (FLOMAX) 0.4 MG CAPS capsule  11/23/20   [provider]  telmisartan (MICARDIS) 80 MG tablet Take 80 mg by mouth daily. 06/07/16   [provider]    Allergies    Patient has no known allergies.  Review of Systems   Review of Systems  Unable to perform ROS: Mental status change    Physical Exam Updated Vital Signs BP (!) 86/66   Pulse (!) 107   Temp 97.6 F (36.4 C) (Axillary)   Resp 15   Ht  (1.803 m)   Wt 76.2 kg  SpO2 96%   BMI 23.43 kg/m   Physical Exam Vitals and nursing note reviewed.  Constitutional:      Appearance: Normal appearance. He is well-developed. He is ill-appearing.  HENT:     Head: Normocephalic and atraumatic.     Mouth/Throat:     Mouth: Mucous membranes are dry.  Eyes:     General: Lids are normal.     Conjunctiva/sclera: Conjunctivae normal.     Pupils: Pupils are equal, round, and reactive to light.     Comments: Pupils 2 mm bilaterally.   Neck:     Comments: Area of soft tissue swelling noted to the anterior neck. No crepitus. No stridor.  Cardiovascular:     Rate and Rhythm: Normal rate. Rhythm irregularly irregular.     Pulses:          Radial pulses are 1+ on the right side and 1+ on the left side.       Femoral pulses are 1+ on the right  side and 1+ on the left side.      Dorsalis pedis pulses are 1+ on the right side and 1+ on the left side.     Heart sounds: Normal heart sounds. No murmur heard. No friction rub. No gallop.   Pulmonary:     Effort: Pulmonary effort is normal.     Breath sounds: Normal breath sounds.  Abdominal:     Palpations: Abdomen is soft. Abdomen is not rigid.     Tenderness: There is no abdominal tenderness. There is no guarding.     Comments: Abdomen is soft, non-distended, non-tender. No rigidity, No guarding. No peritoneal signs.  Genitourinary:    Comments: Foley catheter in place. Musculoskeletal:        General: Normal range of motion.     Cervical back: Full passive range of motion without pain.  Skin:    General: Skin is warm and dry.     Capillary Refill: Capillary refill takes less than 2 seconds.  Neurological:     Mental Status: He is alert.     Comments: Follows some commands  You can tell me his name and where he is at.  He does have some intermittent slurred speech.  There is noted of left-sided facial droop.  Son does report that he has had this previously from his previous stroke.  On my exam, he has equal grip strength bilaterally.  He does exhibit some weakness of the left lower leg.  Son does report that he has had some left-sided deficits since his stroke.  Psychiatric:        Speech: Speech normal.     ED Results / Procedures / Treatments   Labs (all labs ordered are listed, but only abnormal results are displayed) Labs Reviewed  PROTIME-INR - Abnormal; Notable for the following components:      Result Value   Prothrombin Time 28.4 (*)    INR 2.7 (*)    All other components within normal limits  APTT - Abnormal; Notable for the following components:   aPTT 37 (*)    All other components within normal limits  CBC - Abnormal; Notable for the following components:   WBC 15.2 (*)    RBC 3.60 (*)    Hemoglobin 11.0 (*)    HCT 34.3 (*)    RDW 15.9 (*)    All other  components within normal limits  DIFFERENTIAL - Abnormal; Notable for the following components:   Neutro Abs 14.4 (*)  Lymphs Abs 0.6 (*)    All other components within normal limits  COMPREHENSIVE METABOLIC PANEL - Abnormal; Notable for the following components:   Potassium 5.2 (*)    CO2 15 (*)    Glucose, Bld 121 (*)    BUN 24 (*)    Creatinine, Ser 2.32 (*)    Calcium 8.0 (*)    Total Protein 5.8 (*)    Albumin 3.0 (*)    AST 65 (*)    Total Bilirubin 1.8 (*)    GFR, Estimated 26 (*)    All other components within normal limits  URINALYSIS, ROUTINE W REFLEX MICROSCOPIC - Abnormal; Notable for the following components:   Color, Urine AMBER (*)    APPearance CLOUDY (*)    Hgb urine dipstick SMALL (*)    Ketones, ur 5 (*)    Protein, ur 100 (*)    Leukocytes,Ua MODERATE (*)    Bacteria, UA RARE (*)    All other components within normal limits  LACTIC ACID, PLASMA - Abnormal; Notable for the following components:   Lactic Acid, Venous 8.0 (*)    All other components within normal limits  LACTIC ACID, PLASMA - Abnormal; Notable for the following components:   Lactic Acid, Venous 7.4 (*)    All other components within normal limits  I-STAT CHEM 8, ED - Abnormal; Notable for the following components:   Potassium 5.2 (*)    BUN 36 (*)    Creatinine, Ser 2.20 (*)    Glucose, Bld 115 (*)    Calcium, Ion 1.02 (*)    TCO2 16 (*)    Hemoglobin 11.6 (*)    HCT 34.0 (*)    All other components within normal limits  CBG MONITORING, ED - Abnormal; Notable for the following components:   Glucose-Capillary 120 (*)    All other components within normal limits  I-STAT CHEM 8, ED - Abnormal; Notable for the following components:   Creatinine, Ser 2.00 (*)    Calcium, Ion 0.96 (*)    TCO2 13 (*)    Hemoglobin 10.9 (*)    HCT 32.0 (*)    All other components within normal limits  RESP PANEL BY RT-PCR (FLU A&B, COVID) ARPGX2  URINE CULTURE  CULTURE, BLOOD (ROUTINE X 2)  CULTURE,  BLOOD (ROUTINE X 2)  ETHANOL  RAPID URINE DRUG SCREEN, HOSP PERFORMED  CBC  BASIC METABOLIC PANEL  MAGNESIUM  PHOSPHORUS  LACTIC ACID, PLASMA  PROTIME-INR    EKG None  Radiology CT Abdomen Pelvis Wo Contrast  Result Date: 04/14/2021 CLINICAL DATA:  Sepsis. EXAM: CT ABDOMEN AND PELVIS WITHOUT CONTRAST TECHNIQUE: Multidetector CT imaging of the abdomen and pelvis was performed following the standard protocol without IV contrast. COMPARISON:  None. FINDINGS: Lower chest: Mild cardiomegaly. Interstitial coarsening and ground-glass opacities at the lung bases. Small right pleural effusion. Hepatobiliary: No evidence of focal liver abnormality on noncontrast exam. High-density gallbladder contents may represent stones or sludge. Streak artifact from arms down positioning as well as motion limits assessment for gallbladder inflammation Pancreas: Grossly negative, but partially obscured by streak artifact and motion. Spleen: Normal in size without focal abnormality. Adrenals/Urinary Tract: Bilateral adrenal thickening without dominant adrenal nodule. Bilateral perinephric edema, left greater than right. Right renal cysts including a parapelvic cyst in the mid kidney. No hydronephrosis or hydroureter. No visualized renal calculi. Retroperitoneal stranding along the course of the ureters. Foley catheter decompresses the urinary bladder which is thick walled. Stomach/Bowel: There is ingested material within  the stomach. No obvious gastric wall thickening. No small bowel obstruction. No evidence of bowel inflammation or pneumatosis. Appendix not confidently visualized. Multiple colonic diverticula, without diverticulitis. Sigmoid colon is redundant. Vascular/Lymphatic: Aortic atherosclerosis. No aortic aneurysm. Scattered retroperitoneal lymph nodes are not enlarged by size criteria. Reproductive: Prominently enlarged prostate gland spanning 7.7 cm transverse. Other: Fat and trace fluid in both inguinal canals.  Small fat containing umbilical hernia containing trace ascites. Generalized edema of the intra-abdominal and subcutaneous fat. Trace pelvic free fluid no free air. Musculoskeletal: The bones are diffusely under mineralized. There is trabecular coarsening throughout the right hemipelvis as well as L5 consistent with Paget's disease. IMPRESSION: 1. Bilateral perinephric and periureteral edema, left greater than right, suspicious for urinary tract infection. Recommend correlation with urinalysis. 2. Foley catheter in the urinary bladder which is thick walled. Enlarged prostate gland. 3. High-density gallbladder contents may represent stones or sludge. 4. Colonic diverticulosis without diverticulitis. 5. 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 6. Small right pleural effusion. Interstitial coarsening and ground-glass opacities at the lung bases of unknown chronicity. Aortic Atherosclerosis (ICD10-I70.0). Electronically Signed   By: Narda Rutherford M.D.   On: 04/03/2021 20:48   CT Cervical Spine Wo Contrast  Result Date: 04/12/2021 CLINICAL DATA:  Stroke-like symptoms and neck pain, initial encounter EXAM: CT CERVICAL SPINE WITHOUT CONTRAST TECHNIQUE: Multidetector CT imaging of the cervical spine was performed without intravenous contrast. Multiplanar CT image reconstructions were also generated. COMPARISON:  None. FINDINGS: Alignment: Normal. Skull base and vertebrae: 7 cervical segments are well visualized. Vertebral body height is well maintained. Multilevel osteophytic changes are seen as well as facet hypertrophic changes. No acute fracture or acute facet abnormality is noted. Disc space narrowing is noted worst at the C6-7. The odontoid is within normal limits. Soft tissues and spinal canal: Surrounding soft tissue structures are within normal limits. Diffuse vascular calcifications are seen. Upper chest: Visualized lung apices show mild emphysematous change. No other focal abnormality is noted. Other:  None IMPRESSION: Multilevel degenerative change without acute abnormality. Electronically Signed   By: Alcide Clever M.D.   On: 04/20/2021 17:43   DG Chest Portable 1 View  Result Date: 04/12/2021 CLINICAL DATA:  Hypotension EXAM: PORTABLE CHEST 1 VIEW COMPARISON:  None. FINDINGS: Left lung base and lateral aspect of the chest are excluded from the field of view. The heart is enlarged. Aortic atherosclerosis. Diffuse interstitial prominence, with slightly more focal interstitial opacities in the periphery of the right mid lung no pneumothorax or large pleural effusion. IMPRESSION: 1. Cardiomegaly. 2. Diffuse interstitial prominence with slightly more focal interstitial opacities in the periphery of the right mid lung. Findings may represent pulmonary edema or atypical infection. 3. Left lung base and lateral hemithorax are excluded from the field of view. Electronically Signed   By: Narda Rutherford M.D.   On: 04/25/2021 18:29   CT HEAD CODE STROKE WO CONTRAST  Result Date: 03/31/2021 CLINICAL DATA:  Code stroke. Neuro deficit, acute, stroke suspected. Left-sided facial drooping; slurred speech. EXAM: CT HEAD WITHOUT CONTRAST TECHNIQUE: Contiguous axial images were obtained from the base of the skull through the vertex without intravenous contrast. COMPARISON:  No pertinent prior exams available for comparison. FINDINGS: Brain: Mildly motion degraded exam. Mild cerebral and cerebellar atrophy. Small age-indeterminate cortical/subcortical infarct within the mid right frontal lobe (series 3, image 21). Additional small cortically based infarct within the posterior right frontal lobe (motor strip), which appears chronic (series 3, image 22). Moderate patchy and ill-defined hypoattenuation within  the cerebral white matter, nonspecific but compatible chronic small vessel ischemic disease. No evidence of acute intracranial hemorrhage. No extra-axial fluid collection. No evidence of intracranial mass. No midline  shift. Vascular: No hyperdense vessel.  Atherosclerotic calcifications. Skull: Normal. Negative for fracture or focal lesion. Sinuses/Orbits: Visualized orbits show no acute finding. Trace scattered paranasal sinus mucosal thickening. ASPECTS [facility]([place] Stroke Program Early CT Score) - Ganglionic level infarction (caudate, lentiform nuclei, internal capsule, insula, M1-M3 cortex): 7 - Supraganglionic infarction (M4-M6 cortex): 2 Total score (0-10 with 10 being normal): 9 (when discounting a chronic infarct within the posterior right frontal lobe). These results were communicated to Dr. Iver NestleBhagat At 5:47 pmon 06/13/2022by text page via the [facility] messaging system. IMPRESSION: Mildly motion degraded exam. Small age-indeterminate cortical/subcortical infarct within the mid right frontal lobe. ASPECTS is 9. Small chronic appearing cortically-based infarct within the posterior right frontal lobe (motor strip). Background mild generalized parenchymal atrophy and moderate cerebral white matter chronic small vessel ischemic disease. Electronically Signed   By: Jackey LogeKyle  Golden DO   On: 04/06/2021 17:48    Procedures .Critical Care Performed by: Maxwell CaulLayden, Sydnee Lamour A, PA-C Authorized by: Maxwell CaulLayden, [place] Vandergriff A, PA-C   Critical care provider statement:    Critical care time (minutes):  60   Critical care was necessary to treat or prevent imminent or life-threatening deterioration of the following conditions:  Sepsis   Critical care was time spent personally by me on the following activities:  Discussions with consultants, evaluation of patient's response to treatment, examination of patient, ordering and performing treatments and interventions, ordering and review of laboratory studies, ordering and review of radiographic studies, pulse oximetry, re-evaluation of patient's condition, obtaining history from patient or surrogate and review of old charts   I assumed direction of critical care for this patient from another provider in my  specialty: yes     Care discussed with: admitting provider       Medications Ordered in ED Medications  norepinephrine (LEVOPHED) 4mg  in 250mL premix infusion (15 mcg/min Intravenous Rate/Dose Change 04/20/2021 2204)  ceFEPIme (MAXIPIME) 2 g in sodium chloride 0.9 % 100 mL IVPB (0 g Intravenous Stopped 04/10/2021 1845)  vancomycin (VANCOREADY) IVPB 1000 mg/200 mL (has no administration in time range)  lactated ringers infusion ( Intravenous Stopped 04/23/2021 2214)  lactated ringers infusion (has no administration in time range)  docusate sodium (COLACE) capsule 100 mg (has no administration in time range)  polyethylene glycol (MIRALAX / GLYCOLAX) packet 17 g (has no administration in time range)  calcium gluconate 1 g/ 50 mL sodium chloride IVPB (1,000 mg Intravenous New Bag/Given 04/18/2021 2222)  tamsulosin (FLOMAX) capsule 0.4 mg (has no administration in time range)  sodium chloride 0.9 % bolus 1,000 mL (0 mLs Intravenous Stopped 04/15/2021 1811)  sodium chloride 0.9 % bolus 1,000 mL (0 mLs Intravenous Stopped 04/05/2021 1812)  sodium chloride 0.9 % bolus 1,000 mL (0 mLs Intravenous Stopped 04/19/2021 1811)  lactated ringers bolus 1,000 mL (0 mLs Intravenous Stopped 04/05/2021 1829)  vancomycin (VANCOREADY) IVPB 1500 mg/300 mL (0 mg Intravenous Stopped 04/03/2021 2146)  lactated ringers bolus 2,000 mL (2,000 mLs Intravenous New Bag/Given 03/31/2021 2220)    ED Course  I have reviewed the triage vital signs and the nursing notes.  Pertinent labs & imaging results that were available during my care of the patient were reviewed by me and considered in my medical decision making (see chart for details).    MDM Rules/Calculators/A&P  87 year old male brought in for altered mental status.  Son reports that he found patient on the floor next to the bed at about 9-9:30 AM this morning.  But an hour prior to ED arrival, patient stated that he did not feel good and son brought him to the ER.  Patient was  altered.  Patient was initially evaluated in triage found to be hypertensive.  Code stroke called by triage team.  On initial arrival, he is afebrile, hypertensive, tachypneic.  On my exam, he is ill-appearing, responds to some commands, can tell me his name.  He he does appear dry.  Abdomen exam is benign.  He has Foley catheter in place.  Son does report he has some residual left-sided deficits from stroke.  Concern for sepsis given hypotension.  Also concern for intracranial hemorrhage given phone, patient on Eliquis.  Also concern for CVA.   Patient has a Foley catheter in place.  Family reports that this has been in intermittently since he has had a stroke.  They report he had some voiding issues and now uses a Foley catheter.  Unsure of when it was last changed.  Patient evaluated by neuro team.  Code stroke canceled at this time.  Code sepsis initiated.  Patient started on broad-spectrum antibiotics.  CMP shows potassium of 5.2, BUN of 24, creatinine of 2.32.  AST is 65, ALT of 21.  CBC shows leukocytosis of 15.2.  Hemoglobin is 11.0.  He blood glucose is 120.  Lactic is greater than 8. COVID is negative.   Head CT negative.  CT cervical spine negative.  Patient still hypotensive after 3 L of saline and half a liter of lactated Ringer's.  Patient started on Levophed drip.  We will give him an additional liter of lactated Ringer's.  Patient with no heart failure history.  With Levophed drip, patient has had improvement in his blood pressures.  He has been in the low 100s, high 90s.  Mentation status improved significantly.  CT scan shows bilateral perinephric and periureteral edema, left greater than right.  UA shows small hgb, moderate leuks, pyruia, bacteruria.  Suspect his sepsis is likely from urine source.  Foley catheter removed and new 1 inserted.  Discussed patient with Dr. Celine Mans (Critical Care ) who will come evaluate patient in the ED.   Critical care will accept pateint for  admission.   Portions of this note were generated with Scientist, clinical (histocompatibility and immunogenetics). Dictation errors may occur despite best attempts at proofreading.  Final Clinical Impression(s) / ED Diagnoses Final diagnoses:  Sepsis, due to unspecified organism, unspecified whether acute organ dysfunction present [facility])    Rx / DC Orders ED Discharge Orders    None       Rosana Hoes 04/28/2021 2252    Jacalyn Lefevre, MD 03/31/2021 2348

## 2021-04-01 NOTE — ED Notes (Signed)
Patient transported to CT 

## 2021-04-01 NOTE — Consult Note (Signed)
Neurology Consultation Reason for Consult: Code stroke Requesting Physician: Jacalyn Lefevre  CC: Generally feeling unwell  History is obtained from: Patient, ED team and chart review  HPI: Jason Hunter is a 87 y.o. male with a past medical history significant for atrial fibrillation on Eliquis, diabetes, hypertension, hyperlipidemia  His most recent stroke was 09/10/2020 at which time he presented with slurred speech and left-sided facial droop with acute right MCA territory infarcts, noted to have intermittent compliance with Eliquis at the time secondary to occasional bloody penile discharge.  He did develop urinary retention requiring chronic Foley catheter placement.  At baseline he has some residual speech difficulty and occasional imbalance.  Stroke work-up in November was notable for a mildly dilated left atrium with patient in atrial fibrillation, LDL 75, A1c 6.0% and no significant carotid stenosis with an unremarkable MRA  Per ED report, at 11 AM this morning he was found on the floor by his son.  He seemed otherwise at his baseline and got back into bed.  However later in the day he was told his family he was not feeling well and asked to be taken to the ED for further evaluation.  He was noted to be hypotensive in triage and code stroke was activated for focal neurological deficits.  LKW: Unclear given known fall at 11 AM and no family available at the time of my evaluation, but well outside of tPA window given code stroke activation after 5 PM tPA given?: No, due to out of the window Premorbid modified rankin scale: 2-3     2 - Slight disability. Able to look after own affairs without assistance, but unable to carry out all previous activities.     3 - Moderate disability. Requires some help, but able to walk unassisted.  ROS: Unable to obtain due to altered mental status secondary to severe hypotension  Past Medical History:  Diagnosis Date  . Atrial fibrillation (HCC)   .  Diabetes mellitus without complication (HCC)   . Diabetes with neurologic complications (HCC)   . Dyslipidemia   . Essential hypertension   . Hypertension    No past surgical history on file.  Unable to assess secondary to patient's mental status   No family history on file. Unable to assess secondary to patient's mental status   Social History:  reports that he has never smoked. He has never used smokeless tobacco. He reports that he does not drink alcohol and does not use drugs.  Exam: Current vital signs: BP 102/67   Pulse (!) 104   Temp 98.2 F (36.8 C) (Rectal)   Resp (!) 23   Ht 5\' 11"  (1.803 m)   Wt 76.2 kg   SpO2 99%   BMI 23.43 kg/m  Vital signs in last 24 hours: Temp:  [98.2 F (36.8 C)] 98.2 F (36.8 C) (06/02 1800) Pulse Rate:  [34-126] 104 (06/02 1855) Resp:  [15-32] 23 (06/02 1855) BP: (60-109)/(32-94) 102/67 (06/02 1855) SpO2:  [95 %-100 %] 99 % (06/02 1855) Weight:  [76.2 kg] 76.2 kg (06/02 1808)   Physical Exam  Constitutional: Appears acutely ill, very fatigued.  Psych: Affect appropriate to situation, cooperative when awake enough to understand instructions Eyes: No scleral injection HENT: No oropharyngeal obstruction.  Fair dentition MSK: no joint deformities.  Cardiovascular: Rapidly dropping blood pressures requiring aggressive fluid resuscitation Respiratory: Tachypneic but no audible wheezing GI: Soft.  No distension.  GU: Foley in place Skin: Warm dry and intact visible skin  Neuro:  Mental Status: Patient is drowsy to intermittently stuporous, depending on the severity of his hypotension at the moment Cranial Nerves: II: Visual Fields are full to finger counting when he is awake enough to participate. Pupils are equal, round, and reactive to light.   III,IV, VI: EOMI, though pursuits are saccadic and at times he appears to have a gaze preference and 1 way or another, this is inconsistent V: Facial sensation is symmetric to eyelash  brush VII: Facial movement is symmetric.  VIII: hearing is intact to voice Remainder unable to obtain secondary to mental status Motor: He is able to maintain bilateral upper extremities without significant downward drift.  However he only briefly raises each of his legs antigravity Sensory: Sensation is symmetric to light touch and temperature in the arms and legs; grossly as well as per patient's (possibly unreliable) report Deep Tendon Reflexes: 2+ and symmetric in the patellae.  Cerebellar: Unable to assess secondary to patient's mental status   NIHSS total 8  Score breakdown: One-point for drowsiness, one-point for answering month and correctly, 2 points for left leg, 2 points for light leg, one-point for mild to moderate aphasia, one-point for mild dysarthria  I have reviewed labs in epic and the results pertinent to this consultation are: Creatinine 2.2 Potassium 5.2 Leukocytosis to 15.2  INR 2.7 PTT 28.4  I have reviewed the images obtained: Head CT personally reviewed, without clear acute intracranial process "Mildly motion degraded exam. Small age-indeterminate cortical/subcortical infarct within the mid right frontal lobe. ASPECTS is 9. Small chronic appearing cortically-based infarct within the posterior right frontal lobe (motor strip). Background mild generalized parenchymal atrophy and moderate cerebral white matter chronic small vessel ischemic disease."  Impression: At this time patient is hemodynamically unstable for advanced neurological imaging.  Additionally his presentation is more consistent with sepsis/hypotension then with stroke, suspect recrudescence in the setting of hypotension.  In the brief periods when his blood pressure increases secondary to fluid bolus, his exam appears reassuring and certainly not consistent with a large vessel occlusion. Certainly the severity of his hypertension puts him at risk for watershed infarcts, for which MRI brain should  be obtained when patient is medically stabilized  Recommendations: -Appreciate medical stabilization per primary team -Once blood pressure is stabilized if concern remains for new acute neurological change, please reinvolve neurology -Routine MRI brain (order NOT placed to avoid disruption of more urgently necessary care)  Brooke Dare MD-PhD [facility] [phone] Available 7 AM to 7 PM, outside these hours please contact Neurologist on call listed on AMION   Total critical care time: 35 minutes   Critical care time was exclusive of separately billable procedures and treating other patients. Critical care was necessary to treat or prevent imminent or life-threatening deterioration, specifically severe hypotension with acute neurological change. Critical care was time spent personally by me on the following activities: development of treatment plan with patient and/or surrogate as well as nursing, discussions with consultants/primary team, evaluation of patient's response to treatment, examination of patient, obtaining history from patient or surrogate, ordering and performing treatments and interventions, ordering and review of laboratory studies, ordering and review of radiographic studies, and re-evaluation of patient's condition as needed, as documented above.

## 2021-04-01 NOTE — ED Provider Notes (Signed)
Per family member, patient got out of bed around 11 AM this morning and fell on the way to the bathroom.  No head injury.  He is currently on Eliquis.  Since then has been having a left-sided facial droop with slurred speech and trouble ambulating.  Patient is hypotensive in triage but code stroke was activated and was immediately roomed.  There was weakness noted of the left upper extremity and slurred speech on my exam but I was unable to fully evaluate in triage.   Dietrich Pates, PA-C 04/09/2021 1710    Gwyneth Sprout, MD 04/09/2021 [phone]

## 2021-04-01 NOTE — ED Notes (Signed)
Pt returned from CT °

## 2021-04-01 NOTE — Sepsis Progress Note (Signed)
Code Sepsis protocol is being monitored by eLink

## 2021-04-01 NOTE — Code Documentation (Signed)
Stroke Response Nurse Documentation Code Stroke Documentation Jason Hunter  is a 87 y.o.  y.o. male  arriving to [facility] ED via Private Vehicle on 04/07/2021 with PMH of HTN, DM, HLD, A-fib, CVA. Code stroke was activated by ED. Patient from home where he was reportedly found down around 1100 by son, per son pt was "normal" at that time but later came to him and asked to go to the hospital for not feeling well. LKW unknown to [facility]. Patient taking Eliquis (apixaban) daily PTA. Stroke team to the bedside, pt drowsy on assessment and not able to participate fully in exam. CBG 120, labs drawn and patient cleared for CT by EDP L. Layden. Patient taken to CT with team. NIHSS 11, see documentation for details and code stroke times. Patient with decreased LOC, not following commands, left facial droop, bilateral leg weakness, Expressive aphasia  and dysarthria  on exam. The following imaging was completed:  CT. Patient taken back to room to be stabilized d/t hypotension. Patient is not a candidate for tPA due to Eliquis. Current presentation not believed to be stroke, will cancel code stroke per Dr. Iver Nestle. Bedside handoff with ED RN Katrina.    Esmond Camper  Stroke Response RN [phone]

## 2021-04-01 NOTE — ED Notes (Signed)
MD made aware of critical lactic acid

## 2021-04-01 NOTE — ED Notes (Signed)
Pt transported to CT ?

## 2021-04-01 NOTE — ED Triage Notes (Signed)
Family reports pt got up out of bed around 11am this morning and fell in the bathroom. Pt is on blood thinners. Left sided facial droop with drooling, slurred speech, pt unable to bear weight with extremity weakness. Code Stroke activated in triage, BP 70/48 in triage and sent back to room first.

## 2021-04-02 ENCOUNTER — Inpatient Hospital Stay (HOSPITAL_COMMUNITY): Payer: Medicare HMO | Admitting: Certified Registered Nurse Anesthetist

## 2021-04-02 ENCOUNTER — Inpatient Hospital Stay (HOSPITAL_COMMUNITY): Payer: Medicare HMO

## 2021-04-02 DIAGNOSIS — J9600 Acute respiratory failure, unspecified whether with hypoxia or hypercapnia: Secondary | ICD-10-CM

## 2021-04-02 DIAGNOSIS — R6520 Severe sepsis without septic shock: Secondary | ICD-10-CM | POA: Diagnosis not present

## 2021-04-02 DIAGNOSIS — A419 Sepsis, unspecified organism: Secondary | ICD-10-CM | POA: Diagnosis not present

## 2021-04-02 DIAGNOSIS — I469 Cardiac arrest, cause unspecified: Secondary | ICD-10-CM

## 2021-04-02 DIAGNOSIS — N10 Acute tubulo-interstitial nephritis: Secondary | ICD-10-CM | POA: Diagnosis not present

## 2021-04-02 DIAGNOSIS — R6521 Severe sepsis with septic shock: Secondary | ICD-10-CM | POA: Diagnosis not present

## 2021-04-02 DIAGNOSIS — Z01818 Encounter for other preprocedural examination: Secondary | ICD-10-CM

## 2021-04-02 LAB — POCT I-STAT 7, (LYTES, BLD GAS, ICA,H+H)
Acid-base deficit: 11 mmol/L — ABNORMAL HIGH (ref 0.0–2.0)
Bicarbonate: 16.4 mmol/L — ABNORMAL LOW (ref 20.0–28.0)
Calcium, Ion: 1.44 mmol/L — ABNORMAL HIGH (ref 1.15–1.40)
HCT: 29 % — ABNORMAL LOW (ref 39.0–52.0)
Hemoglobin: 9.9 g/dL — ABNORMAL LOW (ref 13.0–17.0)
O2 Saturation: 100 %
Patient temperature: 97.6
Potassium: 4 mmol/L (ref 3.5–5.1)
Sodium: 136 mmol/L (ref 135–145)
TCO2: 18 mmol/L — ABNORMAL LOW (ref 22–32)
pCO2 arterial: 43.1 mmHg (ref 32.0–48.0)
pH, Arterial: 7.185 — CL (ref 7.350–7.450)
pO2, Arterial: 238 mmHg — ABNORMAL HIGH (ref 83.0–108.0)

## 2021-04-02 LAB — CBC
HCT: 32.3 % — ABNORMAL LOW (ref 39.0–52.0)
Hemoglobin: 9.9 g/dL — ABNORMAL LOW (ref 13.0–17.0)
MCH: 30.1 pg (ref 26.0–34.0)
MCHC: 30.7 g/dL (ref 30.0–36.0)
MCV: 98.2 fL (ref 80.0–100.0)
Platelets: 174 10*3/uL (ref 150–400)
RBC: 3.29 MIL/uL — ABNORMAL LOW (ref 4.22–5.81)
RDW: 16.3 % — ABNORMAL HIGH (ref 11.5–15.5)
WBC: 27.5 10*3/uL — ABNORMAL HIGH (ref 4.0–10.5)
nRBC: 0 % (ref 0.0–0.2)

## 2021-04-02 LAB — MRSA PCR SCREENING: MRSA by PCR: NEGATIVE

## 2021-04-02 LAB — GLUCOSE, CAPILLARY
Glucose-Capillary: 18 mg/dL — CL (ref 70–99)
Glucose-Capillary: 257 mg/dL — ABNORMAL HIGH (ref 70–99)
Glucose-Capillary: 141 mg/dL — ABNORMAL HIGH (ref 70–99)

## 2021-04-02 LAB — PHOSPHORUS: Phosphorus: 5.5 mg/dL — ABNORMAL HIGH (ref 2.5–4.6)

## 2021-04-02 LAB — MAGNESIUM: Magnesium: 1.3 mg/dL — ABNORMAL LOW (ref 1.7–2.4)

## 2021-04-02 MED ORDER — EPINEPHRINE HCL 5 MG/250ML IV SOLN IN NS
INTRAVENOUS | Status: AC
  Filled 2021-04-02: qty 250

## 2021-04-02 MED ORDER — DOCUSATE SODIUM 50 MG/5ML PO LIQD: 100.0000 mg | Freq: Two times a day (BID) | ORAL | Status: DC

## 2021-04-02 MED ORDER — CALCIUM CHLORIDE 10 % IV SOLN
INTRAVENOUS | Status: AC
  Filled 2021-04-02: qty 10

## 2021-04-02 MED ORDER — SODIUM BICARBONATE 8.4 % IV SOLN
INTRAVENOUS | Status: DC
  Filled 2021-04-02: qty 1000

## 2021-04-02 MED ORDER — FENTANYL CITRATE (PF) 100 MCG/2ML IJ SOLN: 25.0000 ug | Freq: Once | INTRAMUSCULAR | Status: DC

## 2021-04-02 MED ORDER — SUCCINYLCHOLINE CHLORIDE 20 MG/ML IJ SOLN
INTRAMUSCULAR | Status: DC | PRN
  Administered 2021-04-02: 100 mg via INTRAVENOUS

## 2021-04-02 MED ORDER — LIDOCAINE HCL (CARDIAC) PF 100 MG/5ML IV SOSY
PREFILLED_SYRINGE | INTRAVENOUS | Status: DC | PRN
  Administered 2021-04-02: 40 mg via INTRAVENOUS

## 2021-04-02 MED ORDER — VASOPRESSIN 20 UNITS/100 ML INFUSION FOR SHOCK: 0.0000 [IU]/min | INTRAVENOUS | Status: DC

## 2021-04-02 MED ORDER — POLYETHYLENE GLYCOL 3350 17 G PO PACK: 17.0000 g | PACK | Freq: Every day | ORAL | Status: DC

## 2021-04-02 MED ORDER — FENTANYL 2500MCG IN NS 250ML (10MCG/ML) PREMIX INFUSION: 25.0000 ug/h | INTRAVENOUS | Status: DC

## 2021-04-02 MED ORDER — HYDROCORTISONE NA SUCCINATE PF 100 MG IJ SOLR
50.0000 mg | Freq: Three times a day (TID) | INTRAMUSCULAR | Status: DC
  Administered 2021-04-02: 50 mg via INTRAVENOUS
  Filled 2021-04-02: qty 2

## 2021-04-02 MED ORDER — CALCIUM GLUCONATE-NACL 1-0.675 GM/50ML-% IV SOLN
1.0000 g | Freq: Once | INTRAVENOUS | Status: DC
  Filled 2021-04-02: qty 50

## 2021-04-02 MED ORDER — FENTANYL 2500MCG IN NS 250ML (10MCG/ML) PREMIX INFUSION
0.0000 ug/h | INTRAVENOUS | Status: DC
  Filled 2021-04-02: qty 250

## 2021-04-02 MED ORDER — FENTANYL BOLUS VIA INFUSION
25.0000 ug | INTRAVENOUS | Status: DC | PRN
Start: 2021-04-02 — End: 2021-04-02
  Filled 2021-04-02: qty 100

## 2021-04-02 MED ORDER — HYDROCORTISONE NA SUCCINATE PF 100 MG IJ SOLR: 100.0000 mg | Freq: Three times a day (TID) | INTRAMUSCULAR | Status: DC

## 2021-04-02 MED ORDER — PROPOFOL 1000 MG/100ML IV EMUL: 5.0000 ug/kg/min | INTRAVENOUS | Status: DC

## 2021-04-02 MED ORDER — EPINEPHRINE 1 MG/10ML IJ SOSY
PREFILLED_SYRINGE | INTRAMUSCULAR | Status: AC
  Filled 2021-04-02: qty 30

## 2021-04-02 MED ORDER — PROPOFOL 1000 MG/100ML IV EMUL
INTRAVENOUS | Status: AC
  Filled 2021-04-02: qty 100

## 2021-04-02 MED ORDER — MAGNESIUM SULFATE 2 GM/50ML IV SOLN: 2.0000 g | Freq: Once | INTRAVENOUS | Status: DC

## 2021-04-02 MED ORDER — PROPOFOL 10 MG/ML IV BOLUS
INTRAVENOUS | Status: DC | PRN
  Administered 2021-04-02: 40 mg via INTRAVENOUS

## 2021-04-03 LAB — URINE CULTURE

## 2021-04-04 LAB — CULTURE, BLOOD (ROUTINE X 2)

## 2021-04-05 MED FILL — Medication: Qty: 1 | Status: AC

## 2021-04-06 LAB — CULTURE, BLOOD (ROUTINE X 2): Culture: NO GROWTH

## 2021-04-30 NOTE — Progress Notes (Signed)
Code blue  On my arrival CPR in progress, patient regained ROSC after 2 rounds of CPR receiving Epi. Anesthesia intubating patient. Possible loss of pulse and repeat CPR. Given 2 amps of Bicarb and 1 amp D50 for CBG 18. Patient successfully intubated. Son Loraine Leriche called and given update on patient's condition. He is on his way in. Repeat labs sent.   Additional CC time 25 minutes.  Durel Salts, MD Pulmonary and Critical Care Medicine [facility]

## 2021-04-30 NOTE — Progress Notes (Signed)
NAME:  Jason Hunter, MRN:  [id number], DOB:  1928-06-22, LOS: 1 ADMISSION DATE:  04/12/2021, CONSULTATION DATE:  6/2 REFERRING MD:  Particia Nearing, CHIEF COMPLAINT:  weakness   History of Present Illness:  87 y/o male admitted in setting of confusion and septic shock due to pyelonephritis.  Pertinent  Medical History  Atrial fibrillation DM2 Hyperlipidemia Hypertension  Significant Hospital Events: Including procedures, antibiotic start and stop dates in addition to other pertinent events   . 9/2 admission to ICU, requiring levophed . 9/3 early AM cardiac arrest in setting of worsening shock, hypoglycemia and acidosis  Interim History / Subjective:  Prolonged cardiac arrest since 0540 this morning No purposeful movements Escalating pressor requirements  Objective   Blood pressure 105/72, pulse 76, temperature 97.6 F (36.4 C), temperature source Axillary, resp. rate (!) 26, height 5\' 11"  (1.803 m), weight 76.2 kg, SpO2 (!) 49 %.    Vent Mode: PRVC FiO2 (%):  [100 %] 100 % Set Rate:  [18 bmp] 18 bmp Vt Set:  [600 mL] 600 mL PEEP:  [10 cmH20] 10 cmH20 Plateau Pressure:  [29 cmH20] 29 cmH20   Intake/Output Summary (Last 24 hours) at 04/11/2021 0725 Last data filed at 04/09/2021 0500 Gross per 24 hour  Intake 6340.26 ml  Output 230 ml  Net 6110.26 ml   Filed Weights   04/23/2021 1808  Weight: 76.2 kg    Examination:  General:  In bed on vent HENT: NCAT ETT in place PULM: CTA B, vent supported breathing CV: RRR, no mgr GI: BS+, soft, nontender MSK: normal bulk and tone Neuro: upward gaze, some movements R>L on all four ext    Labs/imaging that I havepersonally reviewed  (right click and "Reselect all SmartList Selections" daily)  Metabolic acidosis on ABG Lactic acidosis CT abdomen with evidence of pyelonephritis  Resolved Hospital Problem list     Assessment & Plan:  Septic shock due to pyelonephritis Acute respiratory failure with hypoxemia ARDS DIC Acute  kidney injury Lactic acidosis PEA Cardiac arrest DNR status  Discussion He has had multiple prolonged cardiac arrests in the setting of septic shock.  He has not responded to ACLS measures for PEA arrest despite adequate mechanical ventilation, vasopressor use, appropriate IV access, fluid resuscitation and IV antibiotics.  He will not survive this illness.  In consultation with the patient's family we made his code status DNR>  Plan: DNR Stop propofol infusion Continue mechanical ventilation until family arrives Continue vasopressor support Continue IV fluids Start fentanyl for comfort/pain relief   Best practice (right click and "Reselect all SmartList Selections" daily)   Code status DNR  Labs   CBC: Recent Labs  Lab 04/15/2021 1707 04/17/2021 1726 03/31/2021 2049 04/20/2021 0628  WBC 15.2*  --   --   --   NEUTROABS 14.4*  --   --   --   HGB 11.0* 11.6* 10.9* 9.9*  HCT 34.3* 34.0* 32.0* 29.0*  MCV 95.3  --   --   --   PLT 256  --   --   --     Basic Metabolic Panel: Recent Labs  Lab 04/08/2021 1707 03/31/2021 1726 04/26/2021 2049 04/22/2021 0628  NA 135 135 137 136  K 5.2* 5.2* 4.1 4.0  CL 106 107 108  --   CO2 15*  --   --   --   GLUCOSE 121* 115* 88  --   BUN 24* 36* 23  --   CREATININE 2.32* 2.20* 2.00*  --  CALCIUM 8.0*  --   --   --    GFR: Estimated Creatinine Clearance: 26.1 mL/min (A) (by C-G formula based on SCr of 2 mg/dL (H)). Recent Labs  Lab 04-07-21 1707 2021/04/07 1900 04/07/21 2044  WBC 15.2*  --   --   LATICACIDVEN  --  8.0* 7.4*    Liver Function Tests: Recent Labs  Lab 2021/04/07 1707  AST 65*  ALT 21  ALKPHOS 99  BILITOT 1.8*  PROT 5.8*  ALBUMIN 3.0*   No results for input(s): LIPASE, AMYLASE in the last 168 hours. No results for input(s): AMMONIA in the last 168 hours.  ABG    Component Value Date/Time   PHART 7.185 (LL) 04/20/2021 0628   PCO2ART 43.1 04/20/2021 0628   PO2ART 238 (H) 04/16/2021 0628   HCO3 16.4 (L) 04/06/2021  0628   TCO2 18 (L) 04/19/2021 0628   ACIDBASEDEF 11.0 (H) 04/25/2021 0628   O2SAT 100.0 04/14/2021 0628     Coagulation Profile: Recent Labs  Lab Apr 07, 2021 1707  INR 2.7*    Cardiac Enzymes: No results for input(s): CKTOTAL, CKMB, CKMBINDEX, TROPONINI in the last 168 hours.  HbA1C: Hgb A1c MFr Bld  Date/Time Value Ref Range Status  09/11/2020 03:19 AM 6.0 (H) 4.8 - 5.6 % Final    Comment:    (NOTE) Pre diabetes:          5.7%-6.4%  Diabetes:              >6.4%  Glycemic control for   <7.0% adults with diabetes   07/29/2007 04:20 AM (H)  Final   7.1 (NOTE)   The ADA recommends the following therapeutic goals for glycemic   control related to Hgb A1C measurement:   Goal of Therapy:   < 7.0% Hgb A1C   Action Suggested:  > 8.0% Hgb A1C   Ref:  Diabetes Care, 22, Suppl. 1, 1999    CBG: Recent Labs  Lab Apr 07, 2021 1701 04/05/2021 0610 04/18/2021 0617 04/04/2021 0654  GLUCAP 120* 18* 257* 141*     Critical care time: 33 minutes     Heber Sanford, MD [place] [facility] Pager: [phone] Cell: [phone] If no response, please call [phone] until 7pm After 7:00 pm call Elink  [phone]

## 2021-04-30 NOTE — Procedures (Signed)
Central Venous Catheter Insertion Procedure Note  Jason Hunter  [id number]  [birth year]  Date:04/18/2021  Time:6:43 AM   Provider Performing:Terrence Wishon Celine Mans   Procedure: Insertion of Non-tunneled Central Venous Catheter(36556) without US guidance  Indication(s) Medication administration and Difficult access  Consent Unable to obtain consent due to emergent nature of procedure.  Anesthesia Topical only with 1% lidocaine   Timeout Verified patient identification, verified procedure, site/side was marked, verified correct patient position, special equipment/implants available, medications/allergies/relevant history reviewed, required imaging and test results available.  Sterile Technique Maximal sterile technique including full sterile barrier drape, hand hygiene, sterile gown, sterile gloves, mask, hair covering, sterile ultrasound probe cover (if used).  Procedure Description Area of catheter insertion was cleaned with chlorhexidine and draped in sterile fashion.  Without real-time ultrasound guidance a central venous catheter was placed into the left subclavian vein. Nonpulsatile blood flow and easy flushing noted in all ports.  The catheter was sutured in place and sterile dressing applied.  Complications/Tolerance None; patient tolerated the procedure well. Chest X-ray is ordered to verify placement for internal jugular or subclavian cannulation.   Chest x-ray is not ordered for femoral cannulation.  EBL Minimal  Specimen(s) None  Rutherford Guys, PA - [facility] [place] [facility] For pager details, please see AMION or use Epic chat  After 1900, please call ELINK for cross coverage needs 04/06/2021, 6:44 AM

## 2021-04-30 NOTE — Progress Notes (Addendum)
eLink Physician-Brief Progress Note Patient Name: Jason Hunter DOB: 1927/11/08 MRN: [id number]   Date of Service  04/10/2021  HPI/Events of Note  Patient admitted with septic shock of suspected urinary tract origin, he was also encephalopathic on presentation.He remains hypotensive despite adequate volume resuscitation.  eICU Interventions  Vasopressin and stress dose steroids ordered. New Patient Evaluation.        Jason Hunter 04/25/2021, 12:42 AM

## 2021-04-30 NOTE — Death Summary Note (Signed)
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  Brief Hospital Course (including significant findings, care, treatment, and services provided and events leading to death)  Jason Hunter is a 87 y.o. male who presented to [facility] ED 2021-04-06 with generalized weakness and AMS.  He was apparently in his usual state of health earlier that morning but was found on the floor next to his bed around 11 AM.  He got back in bed with the assistance of his son who he lives with.  Son states he was pretty much his normal self throughout the rest of the day however later that afternoon, he told son to take him to the ED because "he did not feel good".  Patient tells me that he ate some rice and hamburger meat that son had cooked and after that, started to feel bad.  Of note, he had a recent admission for CVA on 09/10/2020 and since then, has had ongoing left-sided deficits.  Following this, he also had urinary retention and has since had a chronic Foley in place.   Due to AMS and left-sided deficits, code stroke was initially called.  He was seen by neurology and symptoms were felt to be more in line with hypotension and infection; therefore,  code stroke was canceled.  CT head was negative for an acute process.  CT abdomen/pelvis was suspicious for UTI and UA further suggested UTI. In ED, he was hypotensive with systolics in the 89H.  He received 4 L of crystalloid with minimal improvement.  He was subsequently started on Levophed.  PCCM was asked to see in consultation. At the time of evaluation, patient was on 15 mcg/min of Levophed.  His mucous membranes were extremely dry and he had significant skin tenting.  I ordered 2 more liters of LR in hopes that we can wean his Levophed down. His chronic Foley has been changed for a new one in the ED.  He was admitted to the ICU and given IV fluids, antibiotics and vasopressors.  Unfortunately just hours after presenting to our facility shock worsened and he developed acidosis and hypoglycemia and had a cardiac arrest.  He was resuscitated with CPR, ACLS measures, IV fluids, mechanical ventilation. Central lines were placed.  Despite this his shock continued to worsen.  CPR was performed repeatedly between 5:40 AM and 7:30 AM by the ICU team and [facility] code team.  We met with the patient's family and advised DNR/no-code blue status.  They agreed and requested withdrawal of life sustaining measures and asked that we initiate full comfort measures.  He died peacfully with family at the bedside.    Pertinent Labs and Studies  Significant Diagnostic Studies CT Abdomen Pelvis Wo Contrast  Result Date: 04/06/21 CLINICAL DATA:  Sepsis. EXAM: CT ABDOMEN  AND PELVIS WITHOUT CONTRAST TECHNIQUE: Multidetector CT imaging of the abdomen and pelvis was performed following the standard protocol without IV contrast. COMPARISON:  None. FINDINGS: Lower chest: Mild cardiomegaly. Interstitial coarsening and ground-glass opacities at the lung bases. Small right pleural effusion. Hepatobiliary: No evidence of focal liver abnormality on noncontrast exam. High-density gallbladder contents may represent  stones or sludge. Streak artifact from arms down positioning as well as motion limits assessment for gallbladder inflammation Pancreas: Grossly negative, but partially obscured by streak artifact and motion. Spleen: Normal in size without focal abnormality. Adrenals/Urinary Tract: Bilateral adrenal thickening without dominant adrenal nodule. Bilateral perinephric edema, left greater than right. Right renal cysts including a parapelvic cyst in the mid kidney. No hydronephrosis or hydroureter. No visualized renal calculi. Retroperitoneal stranding along the course of the ureters. Foley catheter decompresses the urinary bladder which is thick walled. Stomach/Bowel: There is ingested material within the stomach. No obvious gastric wall thickening. No small bowel obstruction. No evidence of bowel inflammation or pneumatosis. Appendix not confidently visualized. Multiple colonic diverticula, without diverticulitis. Sigmoid colon is redundant. Vascular/Lymphatic: Aortic atherosclerosis. No aortic aneurysm. Scattered retroperitoneal lymph nodes are not enlarged by size criteria. Reproductive: Prominently enlarged prostate gland spanning 7.7 cm transverse. Other: Fat and trace fluid in both inguinal canals. Small fat containing umbilical hernia containing trace ascites. Generalized edema of the intra-abdominal and subcutaneous fat. Trace pelvic free fluid no free air. Musculoskeletal: The bones are diffusely under mineralized. There is trabecular coarsening throughout the right hemipelvis as well as L5 consistent with Paget's disease. IMPRESSION: 1. Bilateral perinephric and periureteral edema, left greater than right, suspicious for urinary tract infection. Recommend correlation with urinalysis. 2. Foley catheter in the urinary bladder which is thick walled. Enlarged prostate gland. 3. High-density gallbladder contents may represent stones or sludge. 4. Colonic diverticulosis without diverticulitis. 5. 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 6. Small right pleural effusion. Interstitial coarsening and ground-glass opacities at the lung bases of unknown chronicity. Aortic Atherosclerosis (ICD10-I70.0). Electronically Signed   By: Keith Rake M.D.   On: 04/05/2021 20:48   CT Cervical Spine Wo Contrast  Result Date: 04/20/2021 CLINICAL DATA:  Stroke-like symptoms and neck pain, initial encounter EXAM: CT CERVICAL SPINE WITHOUT CONTRAST TECHNIQUE: Multidetector CT imaging of the cervical spine was performed without intravenous contrast. Multiplanar CT image reconstructions were also generated. COMPARISON:  None. FINDINGS: Alignment: Normal. Skull base and vertebrae: 7 cervical segments are well visualized. Vertebral body height is well maintained. Multilevel osteophytic changes are seen as well as facet hypertrophic changes. No acute fracture or acute facet abnormality is noted. Disc space narrowing is noted worst at the C6-7. The odontoid is within normal limits. Soft tissues and spinal canal: Surrounding soft tissue structures are within normal limits. Diffuse vascular calcifications are seen. Upper chest: Visualized lung apices show mild emphysematous change. No other focal abnormality is noted. Other: None IMPRESSION: Multilevel degenerative change without acute abnormality. Electronically Signed   By: Inez Catalina M.D.   On: 04/10/2021 17:43   DG CHEST PORT 1 VIEW  Result Date: 04/05/2021 CLINICAL DATA:  87 year old male with history of intubation. EXAM: PORTABLE CHEST 1 VIEW COMPARISON:  Chest x-ray 04/22/2021. FINDINGS: An endotracheal tube is in place with tip 4.0 cm above the carina. There is a left-sided subclavian central venous catheter with tip terminating in the distal superior vena cava. Patchy multifocal airspace consolidation and areas of interstitial prominence are noted throughout the lungs bilaterally, most evident in the periphery of the  right mid to lower lung and in the left lung base. Overall,  aeration has significantly worsened. No definite pleural effusions. No pneumothorax. Mild cardiomegaly. Upper mediastinal contours are within normal limits. Aortic atherosclerosis. IMPRESSION: 1. Support apparatus, as above. 2. Worsening multilobar bilateral pneumonia (right greater than left). 3. Mild cardiomegaly. Electronically Signed   By: Vinnie Langton M.D.   On: 04-10-2021 06:52   DG Chest Portable 1 View  Result Date: 04/28/2021 CLINICAL DATA:  Hypotension EXAM: PORTABLE CHEST 1 VIEW COMPARISON:  None. FINDINGS: Left lung base and lateral aspect of the chest are excluded from the field of view. The heart is enlarged. Aortic atherosclerosis. Diffuse interstitial prominence, with slightly more focal interstitial opacities in the periphery of the right mid lung no pneumothorax or large pleural effusion. IMPRESSION: 1. Cardiomegaly. 2. Diffuse interstitial prominence with slightly more focal interstitial opacities in the periphery of the right mid lung. Findings may represent pulmonary edema or atypical infection. 3. Left lung base and lateral hemithorax are excluded from the field of view. Electronically Signed   By: Keith Rake M.D.   On: 04/03/2021 18:29   CT HEAD CODE STROKE WO CONTRAST  Result Date: 04/14/2021 CLINICAL DATA:  Code stroke. Neuro deficit, acute, stroke suspected. Left-sided facial drooping; slurred speech. EXAM: CT HEAD WITHOUT CONTRAST TECHNIQUE: Contiguous axial images were obtained from the base of the skull through the vertex without intravenous contrast. COMPARISON:  No pertinent prior exams available for comparison. FINDINGS: Brain: Mildly motion degraded exam. Mild cerebral and cerebellar atrophy. Small age-indeterminate cortical/subcortical infarct within the mid right frontal lobe (series 3, image 21). Additional small cortically based infarct within the posterior right frontal lobe (motor strip), which appears chronic (series 3, image 22). Moderate patchy and  ill-defined hypoattenuation within the cerebral white matter, nonspecific but compatible chronic small vessel ischemic disease. No evidence of acute intracranial hemorrhage. No extra-axial fluid collection. No evidence of intracranial mass. No midline shift. Vascular: No hyperdense vessel.  Atherosclerotic calcifications. Skull: Normal. Negative for fracture or focal lesion. Sinuses/Orbits: Visualized orbits show no acute finding. Trace scattered paranasal sinus mucosal thickening. ASPECTS [facility] Stroke Program Early CT Score) - Ganglionic level infarction (caudate, lentiform nuclei, internal capsule, insula, M1-M3 cortex): 7 - Supraganglionic infarction (M4-M6 cortex): 2 Total score (0-10 with 10 being normal): 9 (when discounting a chronic infarct within the posterior right frontal lobe). These results were communicated to Dr. Curly Shores At 5:47 pmon 06/19/2022by text page via the [facility] messaging system. IMPRESSION: Mildly motion degraded exam. Small age-indeterminate cortical/subcortical infarct within the mid right frontal lobe. ASPECTS is 9. Small chronic appearing cortically-based infarct within the posterior right frontal lobe (motor strip). Background mild generalized parenchymal atrophy and moderate cerebral white matter chronic small vessel ischemic disease. Electronically Signed   By: Kellie Simmering DO   On: 04/12/2021 17:48    Microbiology Recent Results (from the past 240 hour(s))  Resp Panel by RT-PCR (Flu A&B, Covid) Nasopharyngeal Swab     Status: None   Collection Time: 04/01/21  5:57 PM   Specimen: Nasopharyngeal Swab; Nasopharyngeal(NP) swabs in vial transport medium  Result Value Ref Range Status   SARS Coronavirus 2 by RT PCR NEGATIVE NEGATIVE Final    Comment: (NOTE) SARS-CoV-2 target nucleic acids are NOT DETECTED.  The SARS-CoV-2 RNA is generally detectable in upper respiratory specimens during the acute phase of infection. The lowest concentration of SARS-CoV-2 viral copies this  assay can detect is 138 copies/mL. A negative result does not preclude SARS-Cov-2 infection and  should not be used as the sole basis for treatment or other patient management decisions. A negative result may occur with  improper specimen collection/handling, submission of specimen other than nasopharyngeal swab, presence of viral mutation(s) within the areas targeted by this assay, and inadequate number of viral copies(<138 copies/mL). A negative result must be combined with clinical observations, patient history, and epidemiological information. The expected result is Negative.  Fact Sheet for Patients:  EntrepreneurPulse.com.au  Fact Sheet for Healthcare Providers:  IncredibleEmployment.be  This test is no t yet approved or cleared by the [place] FDA and  has been authorized for detection and/or diagnosis of SARS-CoV-2 by FDA under an Emergency Use Authorization (EUA). This EUA will remain  in effect (meaning this test can be used) for the duration of the COVID-19 declaration under Section 564(b)(1) of the Act, 21 U.S.C.section 360bbb-3(b)(1), unless the authorization is terminated  or revoked sooner.       Influenza A by PCR NEGATIVE NEGATIVE Final   Influenza B by PCR NEGATIVE NEGATIVE Final    Comment: (NOTE) The Xpert Xpress SARS-CoV-2/FLU/RSV plus assay is intended as an aid in the diagnosis of influenza from Nasopharyngeal swab specimens and should not be used as a sole basis for treatment. Nasal washings and aspirates are unacceptable for Xpert Xpress SARS-CoV-2/FLU/RSV testing.  Fact Sheet for Patients: EntrepreneurPulse.com.au  Fact Sheet for Healthcare Providers: IncredibleEmployment.be  This test is not yet approved or cleared by the [place] FDA and has been authorized for detection and/or diagnosis of SARS-CoV-2 by FDA under an Emergency Use Authorization (EUA). This EUA will  remain in effect (meaning this test can be used) for the duration of the COVID-19 declaration under Section 564(b)(1) of the Act, 21 U.S.C. section 360bbb-3(b)(1), unless the authorization is terminated or revoked.  Performed at [facility], [place] [address], [place], [place] [address]   Culture, blood (routine x 2)     Status: Abnormal   Collection Time: 04/14/2021  5:58 PM   Specimen: BLOOD RIGHT FOREARM  Result Value Ref Range Status   Specimen Description BLOOD RIGHT FOREARM  Final   Special Requests   Final    BOTTLES DRAWN AEROBIC AND ANAEROBIC Blood Culture results may not be optimal due to an inadequate volume of blood received in culture bottles   Culture  Setup Time   Final    GRAM NEGATIVE RODS IN BOTH AEROBIC AND ANAEROBIC BOTTLES CRITICAL RESULT CALLED TO, READ BACK BY AND VERIFIED WITH: PATIENT DISCHARGED OR EXPIRED Performed at [facility], [place] [address], [place], [place] [address]    Culture CITROBACTER FREUNDII (A)  Final   Report Status 04/04/2021 FINAL  Final   Organism ID, Bacteria CITROBACTER FREUNDII  Final      Susceptibility   Citrobacter freundii - MIC*    CEFAZOLIN >=64 RESISTANT Resistant     CEFEPIME <=0.12 SENSITIVE Sensitive     CEFTAZIDIME <=1 SENSITIVE Sensitive     CEFTRIAXONE <=0.25 SENSITIVE Sensitive     CIPROFLOXACIN <=0.25 SENSITIVE Sensitive     GENTAMICIN <=1 SENSITIVE Sensitive     IMIPENEM <=0.25 SENSITIVE Sensitive     TRIMETH/SULFA <=20 SENSITIVE Sensitive     PIP/TAZO <=4 SENSITIVE Sensitive     * CITROBACTER FREUNDII  Urine culture     Status: Abnormal   Collection Time: 04/11/2021  8:12 PM   Specimen: Urine, Random  Result Value Ref Range Status   Specimen Description URINE, RANDOM  Final   Special Requests  Final    NONE Performed at [facility], [place] [address] [place], [place] [address]    Culture MULTIPLE SPECIES PRESENT, SUGGEST RECOLLECTION (A)  Final   Report Status 04/03/2021 FINAL  Final  MRSA PCR  Screening     Status: None   Collection Time: 04/19/2021 11:22 PM   Specimen: Nasal Mucosa; Nasopharyngeal  Result Value Ref Range Status   MRSA by PCR NEGATIVE NEGATIVE Final    Comment:        The GeneXpert MRSA Assay (FDA approved for NASAL specimens only), is one component of a comprehensive MRSA colonization surveillance program. It is not intended to diagnose MRSA infection nor to guide or monitor treatment for MRSA infections. Performed at [facility], [place] [address], [place], [place] [address]   Culture, blood (routine x 2)     Status: None (Preliminary result)   Collection Time: 04/17/2021 11:33 PM   Specimen: BLOOD RIGHT ARM  Result Value Ref Range Status   Specimen Description BLOOD RIGHT ARM  Final   Special Requests   Final    BOTTLES DRAWN AEROBIC ONLY Blood Culture results may not be optimal due to an inadequate volume of blood received in culture bottles   Culture   Final    NO GROWTH 2 DAYS Performed at [facility], [place] [address], [place], [place] [address]    Report Status PENDING  Incomplete    Lab Basic Metabolic Panel: Recent Labs  Lab 04/10/2021 1707 04/12/2021 1726 04/23/2021 2049 04-20-21 0619 04/20/21 0628  NA 135 135 137  --  136  K 5.2* 5.2* 4.1  --  4.0  CL 106 107 108  --   --   CO2 15*  --   --   --   --   GLUCOSE 121* 115* 88  --   --   BUN 24* 36* 23  --   --   CREATININE 2.32* 2.20* 2.00*  --   --   CALCIUM 8.0*  --   --   --   --   MG  --   --   --  1.3*  --   PHOS  --   --   --  5.5*  --    Liver Function Tests: Recent Labs  Lab 04/08/2021 1707  AST 65*  ALT 21  ALKPHOS 99  BILITOT 1.8*  PROT 5.8*  ALBUMIN 3.0*   No results for input(s): LIPASE, AMYLASE in the last 168 hours. No results for input(s): AMMONIA in the last 168 hours. CBC: Recent Labs  Lab 04/24/2021 1707 04/08/2021 1726 04/23/2021 2049 04/20/2021 0619 04/20/21 0628  WBC 15.2*  --   --  27.5*  --   NEUTROABS 14.4*  --   --   --   --   HGB 11.0* 11.6*  10.9* 9.9* 9.9*  HCT 34.3* 34.0* 32.0* 32.3* 29.0*  MCV 95.3  --   --  98.2  --   PLT 256  --   --  174  --    Cardiac Enzymes: No results for input(s): CKTOTAL, CKMB, CKMBINDEX, TROPONINI in the last 168 hours. Sepsis Labs: Recent Labs  Lab 04/25/2021 1707 04/17/2021 1900 04/03/2021 2044 20-Apr-2021 0619  WBC 15.2*  --   --  27.5*  LATICACIDVEN  --  8.0* 7.4*  --     Procedures/Operations  ETT CVL Arterial line   Roselie Awkward 04/04/2021, 3:05 PM

## 2021-04-30 NOTE — Progress Notes (Signed)
Pt went PEA at 0550. CPR was initiated, physicians at bedside. Pt coded multiple times, see code documentation for details. Family called and notified of pts decline. See code sheet for details.

## 2021-04-30 NOTE — Progress Notes (Signed)
   04/12/2021 0735  Clinical Encounter Type  Visited With Patient and family together  Visit Type Death  Referral From Nurse  Consult/Referral To Chaplain  Chaplain responded to page. Per NT(Kenisha) stated Dr. Kendrick Fries requested this Chaplain to return, the patient is actively dying. I greeted Deatra Dallon who stated he was like a stepson to the patient, and they worked together. The Chaplain provided spiritual care presence, comfort, and prayer. The Chaplain was with Trinna Post when Dr. Kendrick Fries provided consultation on the patient's medical condition, the patient's son, Loraine Leriche, was on speaker phone. Loraine Leriche and his girlfriend arrived prior to extubation. The chaplain offered the ministry of comfort and prayer. Alex sang (Well Done,you can come on in)  to the patient as he peacefully transitioned. Loraine Leriche stated he does not know which funeral home. The Chaplain gave him the Patient Placement card. Kipton Skillen is the contact person. His address is [address] [place], [place] [address] [phone]. Advised Chaplain remains available. This note was prepared by Deneen Harts, M.Div..  For questions please contact by phone [phone].

## 2021-04-30 NOTE — Progress Notes (Signed)
   04/15/2021 0558  Clinical Encounter Type  Visited With Patient not available  Visit Type Code  Referral From Nurse  Consult/Referral To Chaplain  Chaplain responded to page. The patient is being attended to by the medical team. There is no family present. Chaplain remains available. This note was prepared by Deneen Harts, M.Div..  For questions please contact by phone [phone].

## 2021-04-30 NOTE — Progress Notes (Signed)
eLink Physician-Brief Progress Note Patient Name: Jason Hunter DOB: 11-12-1927 MRN: [id number]   Date of Service  04/01/2021  HPI/Events of Note  Patient needs sedation medications order as well as an order for soft restraints to prevent self-extubation.  eICU Interventions  Propofol + Fentanyl ordered, bilateral soft wrist restraints ordered.        Thomasene Lot Caeley Dohrmann 04/10/2021, 6:45 AM

## 2021-04-30 NOTE — Anesthesia Procedure Notes (Signed)
Procedure Name: Intubation Date/Time: 04/27/2021 6:12 AM Performed by: Shawana Knoch T, CRNA Pre-anesthesia Checklist: Patient identified, Emergency Drugs available, Suction available and Patient being monitored Patient Re-evaluated:Patient Re-evaluated prior to induction Oxygen Delivery Method: Ambu bag Preoxygenation: Pre-oxygenation with 100% oxygen Induction Type: IV induction and Rapid sequence Ventilation: Mask ventilation without difficulty Laryngoscope Size: Glidescope and 3 Grade View: Grade I Tube type: Oral Tube size: 7.5 mm Number of attempts: 1 Airway Equipment and Method: Stylet,  Oral airway and Video-laryngoscopy Placement Confirmation: ETT inserted through vocal cords under direct vision,  positive ETCO2 and breath sounds checked- equal and bilateral Secured at: 22 cm Tube secured with: Tape Dental Injury: Teeth and Oropharynx as per pre-operative assessment

## 2021-04-30 NOTE — Code Documentation (Signed)
  Patient Name: Jason Hunter   MRN: [id number]   Date of Birth/ Sex: 20-Jul-1928 , male      Admission Date: 04/07/2021  Attending Provider: Charlott Holler, MD  Primary Diagnosis: Sepsis    Indication: Pt was in his usual state of health until this AM, when he was noted to be in PEA. Code blue was subsequently called. At the time of arrival on scene, ACLS protocol was underway.   Technical Description:  - CPR performance duration:  15 minutes  - Was defibrillation or cardioversion used? No   - Was external pacer placed? No  - Was patient intubated pre/post CPR? Yes   Medications Administered: Y = Yes; Blank = No Amiodarone    Atropine    Calcium  Y  Epinephrine  Y  Lidocaine    Magnesium    Norepinephrine    Phenylephrine    Sodium bicarbonate  Y  Vasopressin     Post CPR evaluation:  - Final Status - Was patient successfully resuscitated ? Yes - What is current rhythm? Sinus tachycardia with premature beats - What is current hemodynamic status? Stable (s/p Epi)  Miscellaneous Information:  - Labs sent, including: CBG, previous code labs sent: CMP, CBC w/ diff, Mag, ionized calcium, lactic acid    - Primary team notified?  Yes  - Family Notified? Yes  - Additional notes/ transfer status: PCCM had taken over care. Patient continued to stay intubated in the ICU.      Glenford Bayley, MD  04/04/2021, 7:09 AM

## 2021-04-30 DEATH — deceased

## 2021-08-11 ENCOUNTER — Ambulatory Visit: Payer: Medicare HMO | Admitting: Adult Health

## 2021-12-27 IMAGING — CT CT HEAD CODE STROKE
4 of 5 series · 16 of 47 positions shown, 17 images · non-contrast
Comparison: No pertinent prior exams available for comparison.

CLINICAL DATA: Code stroke. Neuro deficit, acute, stroke suspected.
Left-sided facial drooping; slurred speech.

EXAM:
CT HEAD WITHOUT CONTRAST
TECHNIQUE: Contiguous axial images were obtained from the base of the skull
through the vertex without intravenous contrast.

[Series 2: head 2.0 bone · axial · 0.42mm/px · z∈[+982,+1092]mm · 7 of 83 slices shown]
[im 7/83  bone]
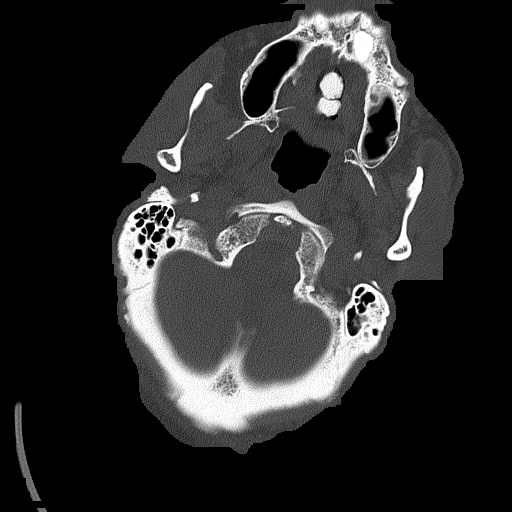
[im 21/83  bone]
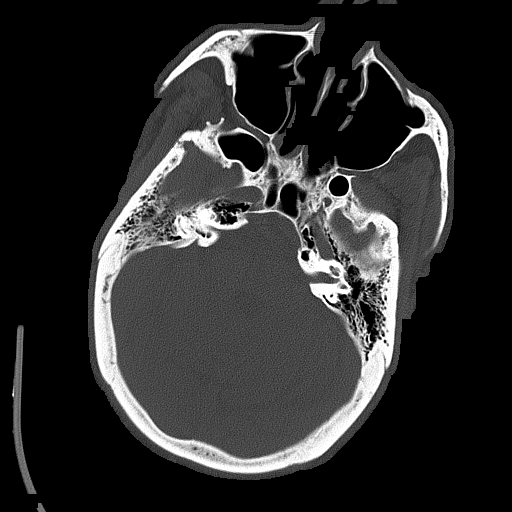
[im 28/83  bone]
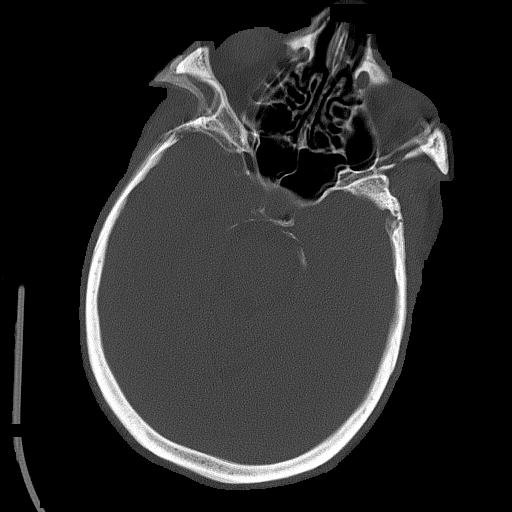
[im 35/83  bone]
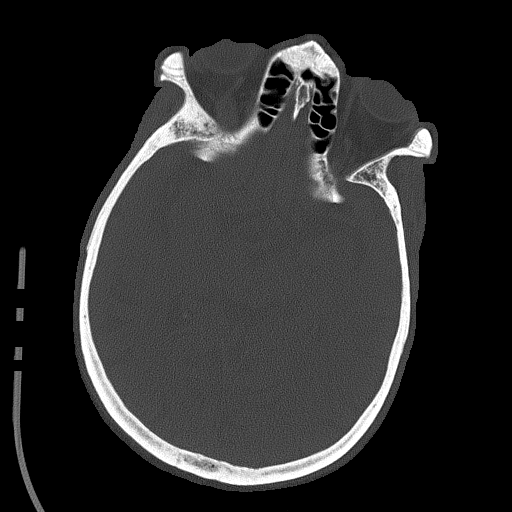
[im 48/83  bone]
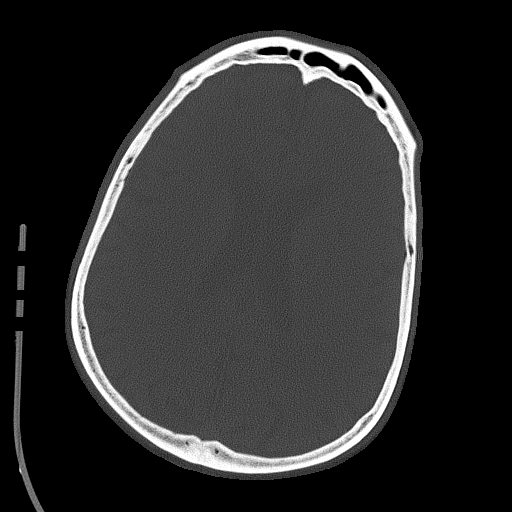
[im 55/83  bone]
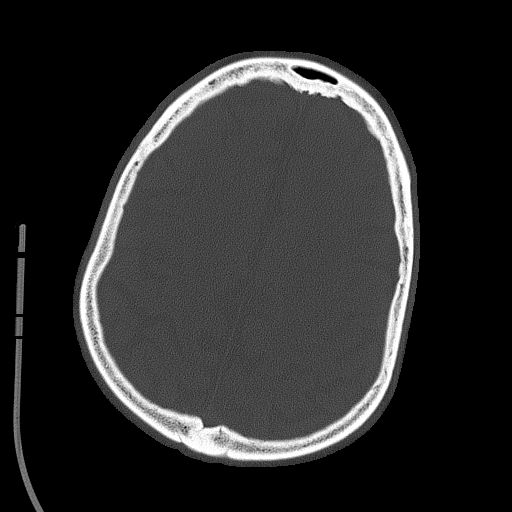
[im 62/83  bone]
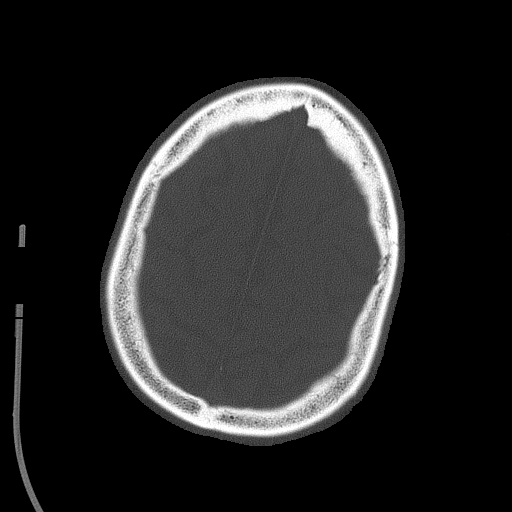

[Series 3: head 5.0 st · axial · 0.42mm/px · z∈[+1005,+1085]mm · 3 of 32 slices shown, 4 images]
[im 8/32  brain]
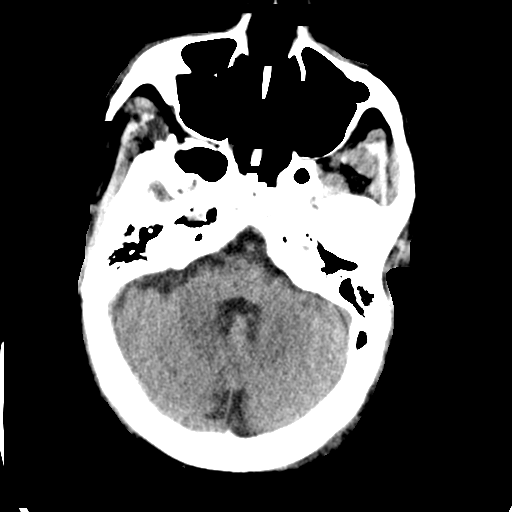
[im 8/32  bone]
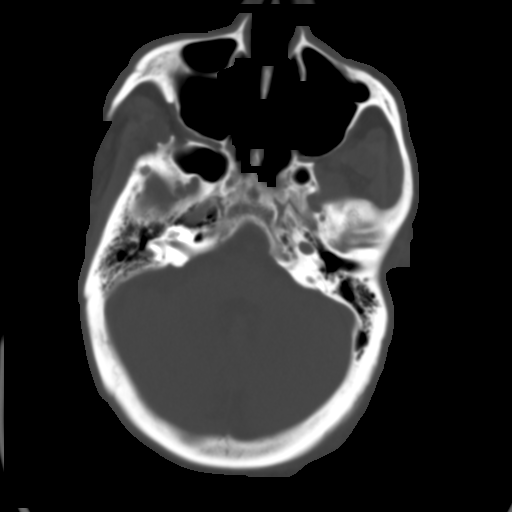
[im 16/32  brain]
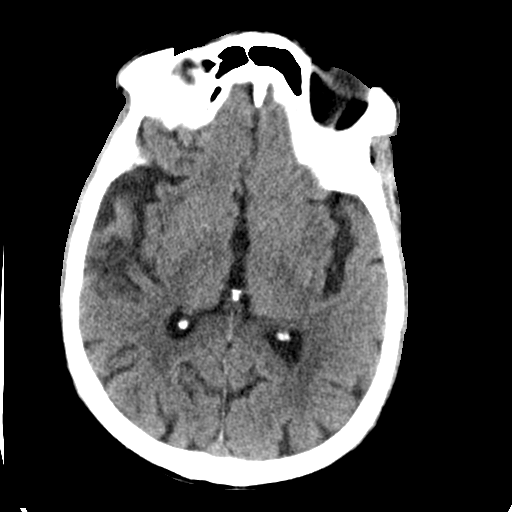
[im 24/32  brain]
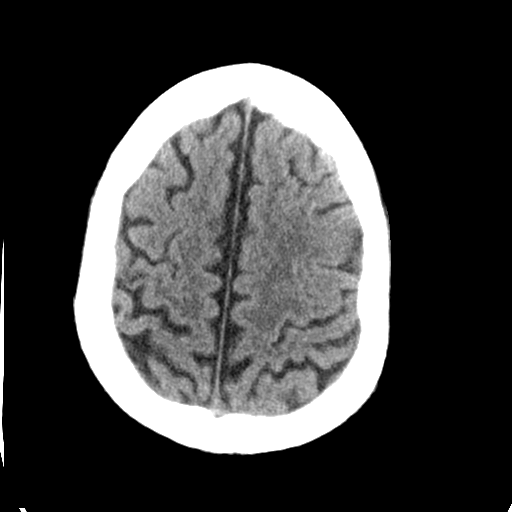

[Series 6: head 3.0 cor st · coronal · 0.32mm/px · 3 of 70 slices shown]
[im 24/70  brain]
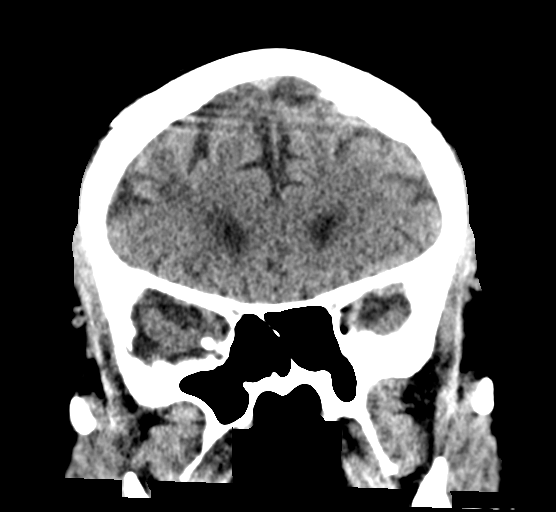
[im 31/70  brain]
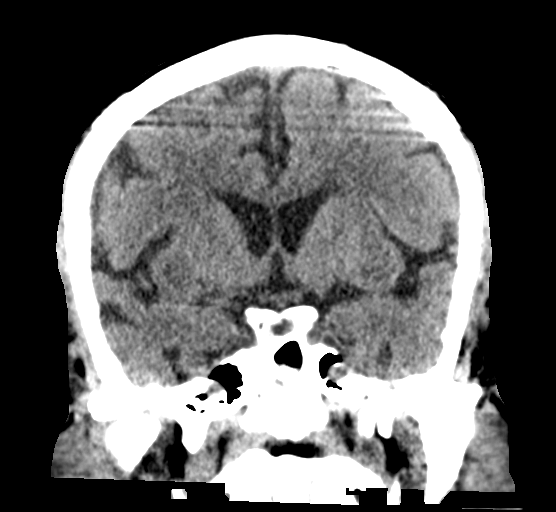
[im 39/70  brain]
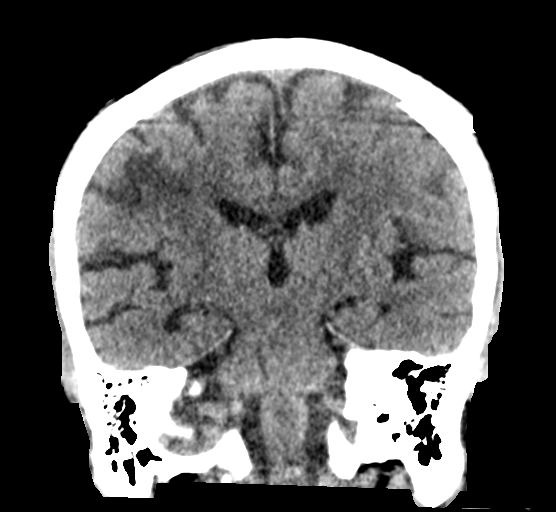

[Series 7: head 3.0 sag st · sagittal · 0.32mm/px · 3 of 56 slices shown]
[im 19/56  brain]
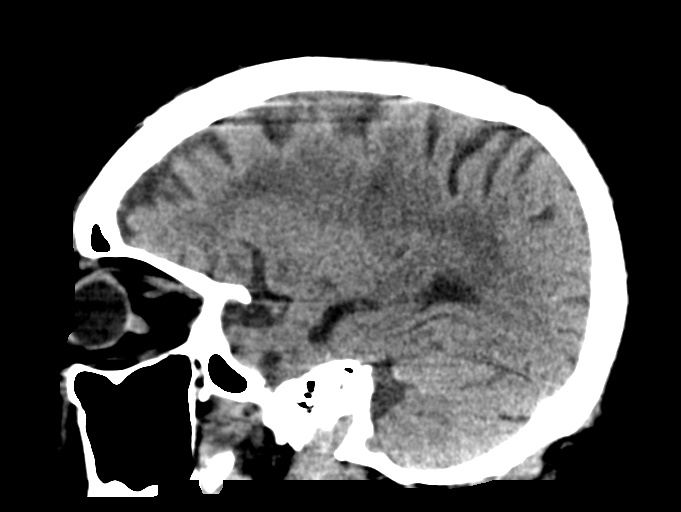
[im 28/56  brain]
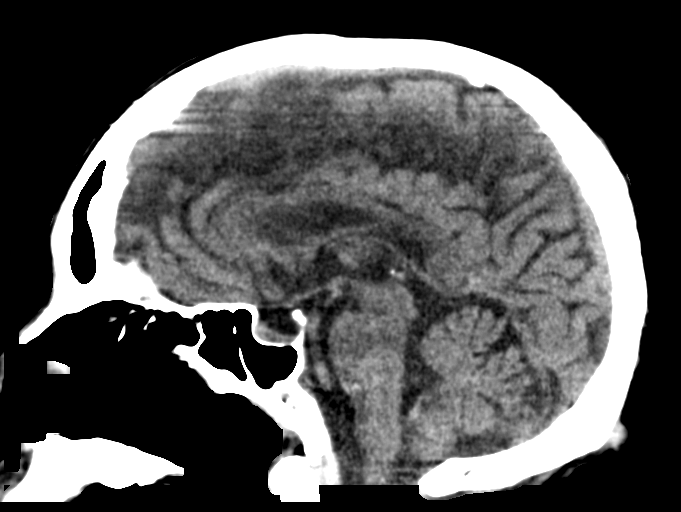
[im 37/56  brain]
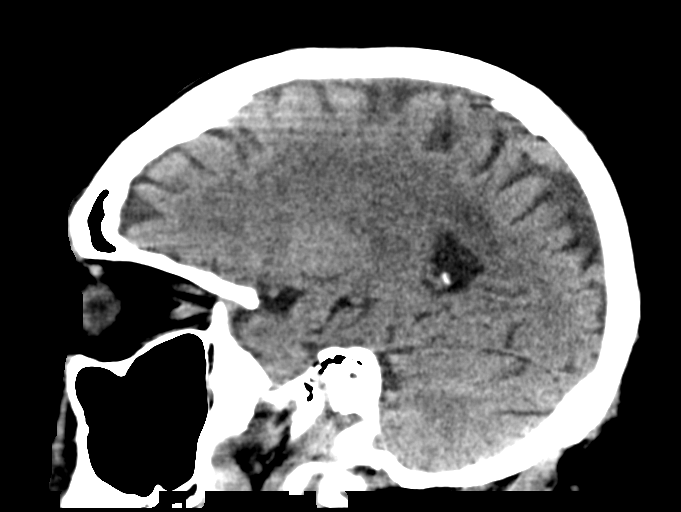

[16 of 47 positions shown; findings below may reference images not displayed]

FINDINGS: Brain:

Mildly motion degraded exam.

Mild cerebral and cerebellar atrophy.

Small age-indeterminate cortical/subcortical infarct within the mid
right frontal lobe (series 3, image 21).

Additional small cortically based infarct within the posterior right
frontal lobe (motor strip), which appears chronic (series 3, image
22).

Moderate patchy and ill-defined hypoattenuation within the cerebral
white matter, nonspecific but compatible chronic small vessel
ischemic disease.

No evidence of acute intracranial hemorrhage.

No extra-axial fluid collection.

No evidence of intracranial mass.

No midline shift.

Vascular: No hyperdense vessel.  Atherosclerotic calcifications.

Skull: Normal. Negative for fracture or focal lesion.

Sinuses/Orbits: Visualized orbits show no acute finding. Trace
scattered paranasal sinus mucosal thickening.

ASPECTS ([place] Stroke Program Early CT Score)

- Ganglionic level infarction (caudate, lentiform nuclei, internal
capsule, insula, M1-M3 cortex): 7

- Supraganglionic infarction (M4-M6 cortex): 2

Total score (0-10 with 10 being normal): 9 (when discounting a
chronic infarct within the posterior right frontal lobe).

These results were communicated to Dr. Vaamonde At [DATE] pmon
04/01/2021by text page via the [employer] messaging system.
IMPRESSION: Mildly motion degraded exam.

Small age-indeterminate cortical/subcortical infarct within the mid
right frontal lobe. ASPECTS is 9.

Small chronic appearing cortically-based infarct within the
posterior right frontal lobe (motor strip).

Background mild generalized parenchymal atrophy and moderate
cerebral white matter chronic small vessel ischemic disease.
